# Patient Record
Sex: Female | Born: 1955 | Race: Asian | Hispanic: No | Marital: Married | Smoking: Never smoker
Health system: Southern US, Community
[De-identification: ages and names within clinical notes are randomized; demographics above are authoritative.]

## PROBLEM LIST (undated history)

## (undated) DIAGNOSIS — R140 Abdominal distension (gaseous): Secondary | ICD-10-CM

## (undated) DIAGNOSIS — R110 Nausea: Secondary | ICD-10-CM

## (undated) DIAGNOSIS — T7840XA Allergy, unspecified, initial encounter: Secondary | ICD-10-CM

## (undated) DIAGNOSIS — M1990 Unspecified osteoarthritis, unspecified site: Secondary | ICD-10-CM

## (undated) DIAGNOSIS — K219 Gastro-esophageal reflux disease without esophagitis: Secondary | ICD-10-CM

## (undated) DIAGNOSIS — M810 Age-related osteoporosis without current pathological fracture: Secondary | ICD-10-CM

## (undated) DIAGNOSIS — F419 Anxiety disorder, unspecified: Secondary | ICD-10-CM

## (undated) DIAGNOSIS — K5900 Constipation, unspecified: Secondary | ICD-10-CM

## (undated) HISTORY — DX: Allergy, unspecified, initial encounter: T78.40XA

## (undated) HISTORY — DX: Gastro-esophageal reflux disease without esophagitis: K21.9

## (undated) HISTORY — DX: Abdominal distension (gaseous): R14.0

## (undated) HISTORY — DX: Nausea: R11.0

## (undated) HISTORY — PX: COLONOSCOPY: SHX174

## (undated) HISTORY — DX: Anxiety disorder, unspecified: F41.9

## (undated) HISTORY — DX: Age-related osteoporosis without current pathological fracture: M81.0

## (undated) HISTORY — DX: Unspecified osteoarthritis, unspecified site: M19.90

## (undated) HISTORY — PX: UPPER GASTROINTESTINAL ENDOSCOPY: SHX188

## (undated) HISTORY — DX: Constipation, unspecified: K59.00

---

## 1999-01-30 ENCOUNTER — Ambulatory Visit (HOSPITAL_COMMUNITY): Admission: RE | Admit: 1999-01-30 | Discharge: 1999-01-30 | Payer: Self-pay | Admitting: Family Medicine

## 1999-01-30 ENCOUNTER — Encounter: Payer: Self-pay | Admitting: Family Medicine

## 2002-06-19 ENCOUNTER — Ambulatory Visit: Admission: RE | Admit: 2002-06-19 | Discharge: 2002-06-19 | Payer: Self-pay | Admitting: Family Medicine

## 2005-02-25 ENCOUNTER — Other Ambulatory Visit: Admission: RE | Admit: 2005-02-25 | Discharge: 2005-02-25 | Payer: Self-pay | Admitting: Family Medicine

## 2006-03-08 ENCOUNTER — Other Ambulatory Visit: Admission: RE | Admit: 2006-03-08 | Discharge: 2006-03-08 | Payer: Self-pay | Admitting: Family Medicine

## 2007-05-01 ENCOUNTER — Other Ambulatory Visit: Admission: RE | Admit: 2007-05-01 | Discharge: 2007-05-01 | Payer: Self-pay | Admitting: Family Medicine

## 2008-10-02 ENCOUNTER — Encounter: Payer: Self-pay | Admitting: Internal Medicine

## 2008-10-22 ENCOUNTER — Ambulatory Visit: Payer: Self-pay | Admitting: Internal Medicine

## 2008-10-22 DIAGNOSIS — Z8719 Personal history of other diseases of the digestive system: Secondary | ICD-10-CM | POA: Insufficient documentation

## 2008-10-22 DIAGNOSIS — R1012 Left upper quadrant pain: Secondary | ICD-10-CM | POA: Insufficient documentation

## 2008-10-22 DIAGNOSIS — K5900 Constipation, unspecified: Secondary | ICD-10-CM | POA: Insufficient documentation

## 2008-11-21 ENCOUNTER — Encounter: Payer: Self-pay | Admitting: Internal Medicine

## 2008-11-21 ENCOUNTER — Ambulatory Visit: Payer: Self-pay | Admitting: Internal Medicine

## 2008-11-23 ENCOUNTER — Encounter: Payer: Self-pay | Admitting: Internal Medicine

## 2009-01-27 ENCOUNTER — Ambulatory Visit: Payer: Self-pay | Admitting: Internal Medicine

## 2009-02-11 ENCOUNTER — Telehealth: Payer: Self-pay | Admitting: Internal Medicine

## 2009-03-11 ENCOUNTER — Telehealth: Payer: Self-pay | Admitting: Internal Medicine

## 2010-07-14 NOTE — Progress Notes (Signed)
Summary: wave cx fee  Phone Note Call from Patient Call back at Home Phone [phone]   Caller: Patient Call For: gessner Summary of Call: Patient states that she cx thru our phone tree and I verified that . We left her a message for her to cb and verify that she did want to cx but she never called back but she did hit the option to cx. Initial call taken by: Tawni Levy,  February 11, 2009 8:16 AM  Follow-up for Phone Call        ok wave the fee Follow-up by: Iva Boop MD, Clementeen Graham,  February 14, 2009 9:08 AM  Additional Follow-up for Phone Call Additional follow up Details #1::        02-18-09. Left message for patient regarding CX fee. We will waive this fee. I will send over to charge correction for credit.  02-19-09 No returned call from patient. Additional Follow-up by: Zackery Barefoot,  February 18, 2009 10:47 AM

## 2010-07-14 NOTE — Progress Notes (Signed)
Summary: NS fee  Phone Note Call from Patient Call back at Home Phone [phone]   Caller: Patient Call For: Dr. Leone Payor Reason for Call: Insurance Question Details for Reason: NS fee Summary of Call: 02-20-09 Called patient regarding NS fee. Agreed to waive fee due to patient cancelling on phone tree and was confirmed. I notified charge correction of this also and has been credited. Initial call taken by: Zackery Barefoot,  March 11, 2009 3:33 PM

## 2010-07-14 NOTE — Letter (Signed)
Summary: Patient Notice-Endo Biopsy Results  [facility]  [address] [place], [place] [address]   Phone: [phone]  Fax: [phone]        November 23, 2008 MRN: [id number]    [name] [address] [place], [place]  [address]    Dear [name],  I am pleased to inform you that the biopsies taken during your recent endoscopic examination did not show any evidence of cancer upon pathologic examination. They did show some inflammation of the stomach called gastritis. No infection was found.  Please take the medication that was prescibed and return to me in the office as directed.  Please call us if you are having persistent problems or have questions about your condition that have not been fully answered at this time.  Sincerely,  Iva Boop MD  This letter has been electronically signed by your physician.

## 2010-07-14 NOTE — Procedures (Signed)
Summary: EGD: erosive duodenitis, gastritis, H pylori negative   EGD  Procedure date:  11/21/2008  Findings:      Location: [facility]    EGD  Procedure date:  11/21/2008  Findings:      1) Erosion in the bulb of duodenum 2) Duodenitis in the bulb of duodenum 3) Moderate gastritis (biopsied) CHRONIC GASTRITIS NO H PYLORI 4) Otherwise normal examination  ENDOSCOPY PROCEDURE REPORT  PATIENTEurydice, [name]  MR#:  [id number] BIRTHDATE:   06-01-1956, 55 yrs. old   GENDER:   female  ENDOSCOPIST:   Iva Boop, MD, [facility] Referred by: Talbot Grumbling. Creta Levin, M.D.  PROCEDURE DATE:  11/21/2008 PROCEDURE:  EGD with biopsy ASA CLASS:   Class I INDICATIONS: abdominal pain, left upper quadr   MEDICATIONS:    Fentanyl 25 mcg IV, Versed 4 mg IV TOPICAL ANESTHETIC:   Exactacain Spray  DESCRIPTION OF PROCEDURE:   After the risks benefits and alternatives of the procedure were thoroughly explained, informed consent was obtained.  The [facility] GIF-H180 [id number] endoscope was introduced through the mouth and advanced to the second portion of the duodenum, without limitations.  The instrument was slowly withdrawn as the mucosa was fully examined. <<PROCEDUREIMAGES>>          <<OLD IMAGES>>  An erosion was found in the bulb of the duodenum.  Duodenitis was found in the bulb of the duodenum. Granular and erythematous mucosa with one erosion (1 mm).  Moderate gastritis was found. Patchy erythema, whie mucosal change, mottled mucosa.  Multiple biopsies were obtained and sent to pathology.  Otherwise the examination was normal.    Retroflexed views revealed no abnormalities.    The scope was then withdrawn from the patient and the procedure completed.  COMPLICATIONS:   None  ENDOSCOPIC IMPRESSION:  1) Erosion in the bulb of duodenum  2) Duodenitis in the bulb of duodenum  3) Moderate gastritis (biopsied)  4) Otherwise normal examination RECOMMENDATIONS:  Start pantoprazole 40 mg  each AM and stop Zantac (ranitidine).  Go to pharmacy to pick up.  REPEAT EXAM:   In for not necessary.    Iva Boop, MD, Clementeen Graham    CC: Talbot Grumbling. Creta Levin, MD The Patient     REPORT OF SURGICAL PATHOLOGY   Case #: [id number] Patient Name: [name] Office Chart Number:  [id number]   MRN: [id number] Pathologist: Beulah Gandy. Luisa Hart, MD DOB/Age  [birth year] (Age: 55)    Gender: F Date Taken:  11/21/2008 Date Received: 11/21/2008   FINAL DIAGNOSIS   ***MICROSCOPIC EXAMINATION AND DIAGNOSIS***   STOMACH:  CHRONIC GASTRITIS.  NO HELICOBACTER PYLORI, DYSPLASIA OR EVIDENCE OF MALIGNANCY IDENTIFIED.   COMMENT A Warthin-Starry stain is performed to determine the possibility of the presence of Helicobacter pylori. The Warthin-Starry stain is negative for organisms of Helicobacter pylori. The control(s) stained appropriately. (JP:jy) 11/22/08   jy Date Reported:  11/22/2008     Beulah Gandy. Luisa Hart, MD *** Electronically Signed Out By JDP ***     November 23, 2008 MRN: [id number]    [name] [address] [place], [place]  [address]   Dear [name],  I am pleased to inform you that the biopsies taken during your recent endoscopic examination did not show any evidence of cancer upon pathologic examination. They did show some inflammation of the stomach called gastritis. No infection was found.  Please take the medication that was prescibed and return to me in the office as directed.  Please call us if  you are having persistent problems or have questions about your condition that have not been fully answered at this time.  Sincerely,  Iva Boop MD  This letter has been electronically signed by your physician. This report was created from the original endoscopy report, which was reviewed and signed by the above listed endoscopist.

## 2010-07-14 NOTE — Progress Notes (Signed)
Summary: Med List/[facility]  Med List/[facility]   Imported By: Sherian Rein 11/06/2008 13:22:37  _____________________________________________________________________  External Attachment:    Type:   Image     Comment:   External Document

## 2010-07-14 NOTE — Procedures (Signed)
Summary: Colonoscopy: internal hemorrhoids, Amitiza started   Colonoscopy  Procedure date:  11/21/2008  Findings:      Location:  [facility].    Procedures Next Due Date:    Colonoscopy: 11/2018  COLONOSCOPY PROCEDURE REPORT  PATIENT:  Nalaysia, Manganiello  MR#:  [id number] BIRTHDATE:   17-Oct-1955, 52 yrs. old   GENDER:   female  ENDOSCOPIST:   Iva Boop, MD, [facility] Referred by: Talbot Grumbling. Creta Levin, M.D.  PROCEDURE DATE:  11/21/2008 PROCEDURE:  Colonoscopy 25366 ASA CLASS:   Class I INDICATIONS: Routine Risk Screening   MEDICATIONS:    Fentanyl 25 mcg, There was residual sedation effect present from prior procedure.  DESCRIPTION OF PROCEDURE:   After the risks benefits and alternatives of the procedure were thoroughly explained, informed consent was obtained.  Digital rectal exam was performed and revealed no abnormalities.   The [facility] [id number]-H180AL [id number] endoscope was introduced through the anus and advanced to the cecum in 2:15 minutes, which was identified by both the appendix and ileocecal valve, without limitations.  The quality of the prep was excellent, using MoviPrep.  The instrument was then slowly withdrawn as the colon was fully examined in 7:07 minutes. <<PROCEDUREIMAGES>>    <<OLD IMAGES>>  FINDINGS:  A normal appearing cecum, ileocecal valve, and appendiceal orifice were identified. The ascending, hepatic flexure, transverse, splenic flexure, descending, sigmoid colon, and rectum appeared unremarkable.   Retroflexed views in the rectum revealed internal hemorrhoids.    The scope was then withdrawn from the patient and the procedure completed.  COMPLICATIONS:   None  ENDOSCOPIC IMPRESSION:  1) Normal colon, excellent prep  2) Internal hemorrhoids in rectum RECOMMENDATIONS:  Start Amitiza 8 mcg 2 times a day for chronic constipation. Go to pharmacyto pick up.    Call Dr. Marvell Fuller office to see him again in 6- 8 weeks (July-Aug appointment)  REPEAT  EXAM:   In 10 year(s) for routine screening colonocsopy.    Iva Boop, MD, Clementeen Graham  CC: Halina Maidens, MD The Patient    Appended Document: Colonoscopy Left message on patient's voicemail that we have scheduled her a follow up appointment to see Dr Leone Payor on January 08, 2009 @ 1:30 pm. Patient is to call our office if this time and date is not convienient for her.

## 2010-07-14 NOTE — Miscellaneous (Signed)
Summary: Pantoprazole/Amitiza Rx

## 2011-08-31 ENCOUNTER — Ambulatory Visit (INDEPENDENT_AMBULATORY_CARE_PROVIDER_SITE_OTHER): Payer: PRIVATE HEALTH INSURANCE | Admitting: Family Medicine

## 2011-08-31 VITALS — BP 129/88 | HR 78 | Temp 97.4°F | Resp 16 | Ht 60.5 in | Wt 139.2 lb

## 2011-08-31 DIAGNOSIS — J309 Allergic rhinitis, unspecified: Secondary | ICD-10-CM

## 2011-08-31 DIAGNOSIS — J329 Chronic sinusitis, unspecified: Secondary | ICD-10-CM

## 2011-08-31 DIAGNOSIS — J019 Acute sinusitis, unspecified: Secondary | ICD-10-CM

## 2011-08-31 DIAGNOSIS — R059 Cough, unspecified: Secondary | ICD-10-CM

## 2011-08-31 DIAGNOSIS — J301 Allergic rhinitis due to pollen: Secondary | ICD-10-CM

## 2011-08-31 DIAGNOSIS — R05 Cough: Secondary | ICD-10-CM

## 2011-08-31 MED ORDER — HYDROCODONE-HOMATROPINE 5-1.5 MG/5ML PO SYRP: 5.0000 mL | ORAL_SOLUTION | Freq: Three times a day (TID) | ORAL | Status: DC | PRN

## 2011-08-31 MED ORDER — BENZONATATE 100 MG PO CAPS: 100.0000 mg | ORAL_CAPSULE | Freq: Two times a day (BID) | ORAL | Status: AC | PRN

## 2011-08-31 MED ORDER — AZITHROMYCIN 250 MG PO TABS: ORAL_TABLET | ORAL | Status: AC

## 2011-08-31 MED ORDER — FLUTICASONE PROPIONATE 50 MCG/ACT NA SUSP: 2.0000 | Freq: Every day | NASAL | Status: AC

## 2011-08-31 NOTE — Progress Notes (Signed)
  [facility]:  Office Visit  Chief Complaint:  Chief Complaint  Patient presents with  . Cough    1 week  . Sinusitis    HPI: [name] is a 56 y.o. female who complains of  1 week h/o sinus pressure, frontal HA, and prodcutive clear cough with PND.  Denies Fevers, chills, msk aches, CP, SOB. + Allergies, on Zyrtec ( off and on). Tried OTC cough meds and NSAIDs without relief.  Past Medical History  Diagnosis Date  . Allergy    History reviewed. No pertinent past surgical history. History   Social History  . Marital Status: Married    Spouse Name: N/A    Number of Children: N/A  . Years of Education: N/A   Social History Main Topics  . Smoking status: Never Smoker   . Smokeless tobacco: None  . Alcohol Use: No  . Drug Use: No  . Sexually Active: None   Other Topics Concern  . None   Social History Narrative  . None   Family History  Problem Relation Age of Onset  . Arthritis Mother   . Hypertension Mother    No Known Allergies Prior to Admission medications   Medication Sig Start Date End Date Taking? Authorizing Provider  calcium carbonate 200 MG capsule Take 250 mg by mouth 2 (two) times daily with a meal.   Yes Historical Provider, MD  fish oil-omega-3 fatty acids 1000 MG capsule Take 2 g by mouth daily.   Yes Historical Provider, MD     ROS: The patient denies fevers, chills, night sweats, unintentional weight loss, chest pain, palpitations, wheezing, dyspnea on exertion, nausea, vomiting, abdominal pain, dysuria, hematuria, melena, numbness, weakness, or tingling. + cough  All other systems have been reviewed and were otherwise negative with the exception of those mentioned in the HPI and as above.    PHYSICAL EXAM: Filed Vitals:   08/31/11 1051  BP: 129/88  Pulse: 78  Temp: 97.4 F (36.3 C)  Resp: 16   Filed Vitals:   08/31/11 1051  Height: 5' 0.5" (1.537 m)  Weight: 139 lb 3.2 oz (63.141 kg)   Body mass index is 26.74  kg/(m^2).  General: Alert, no acute distress HEENT:  Normocephalic, atraumatic, oropharynx patent. Tm nl, + sinus pressure, erythematous boggy nares, OP erythematous, nl tonsils Cardiovascular:  Regular rate and rhythm, no rubs murmurs or gallops.  No Carotid bruits, radial pulse intact. No pedal edema.  Respiratory: Clear to auscultation bilaterally.  No wheezes, rales, or rhonchi.  No cyanosis, no use of accessory musculature GI: No organomegaly, abdomen is soft and non-tender, positive bowel sounds.  No masses. Skin: No rashes. Neurologic: Facial musculature symmetric. Psychiatric: Patient is appropriate throughout our interaction. Lymphatic: No cervical lymphadenopathy Musculoskeletal: Gait intact.   LABS: No results found for this or any previous visit.   EKG/XRAY:   Primary read interpreted by Dr. Conley Rolls at [facility].   ASSESSMENT/PLAN: Encounter Diagnoses  Name Primary?  . Sinusitis Yes  . Rhinitis, allergic     Sxs treatment first then abx if no improvement in 4-5 days.  Flonase, tessalon perles, hydromet. Again emphasize that if no improvement then take Z-pack.    Reynard Christoffersen PHUONG, DO 08/31/2011 11:43 AM

## 2012-06-10 ENCOUNTER — Ambulatory Visit: Payer: PRIVATE HEALTH INSURANCE

## 2012-06-10 ENCOUNTER — Ambulatory Visit (INDEPENDENT_AMBULATORY_CARE_PROVIDER_SITE_OTHER): Payer: PRIVATE HEALTH INSURANCE | Admitting: Family Medicine

## 2012-06-10 VITALS — BP 159/90 | HR 93 | Temp 100.8°F | Resp 16 | Ht 61.0 in | Wt 133.6 lb

## 2012-06-10 DIAGNOSIS — R509 Fever, unspecified: Secondary | ICD-10-CM

## 2012-06-10 DIAGNOSIS — R0781 Pleurodynia: Secondary | ICD-10-CM

## 2012-06-10 DIAGNOSIS — J040 Acute laryngitis: Secondary | ICD-10-CM

## 2012-06-10 DIAGNOSIS — R079 Chest pain, unspecified: Secondary | ICD-10-CM

## 2012-06-10 DIAGNOSIS — R059 Cough, unspecified: Secondary | ICD-10-CM

## 2012-06-10 DIAGNOSIS — R05 Cough: Secondary | ICD-10-CM

## 2012-06-10 LAB — POCT CBC
Granulocyte percent: 83.7 %G — AB (ref 37–80)
HCT, POC: 42.6 % (ref 37.7–47.9)
Hemoglobin: 13.2 g/dL (ref 12.2–16.2)
Lymph, poc: 1.5 (ref 0.6–3.4)
MCH, POC: 29.5 pg (ref 27–31.2)
MCHC: 31 g/dL — AB (ref 31.8–35.4)
MCV: 95.2 fL (ref 80–97)
MID (cbc): 0.5 (ref 0–0.9)
MPV: 8.9 fL (ref 0–99.8)
POC Granulocyte: 10.2 — AB (ref 2–6.9)
POC LYMPH PERCENT: 12 %L (ref 10–50)
POC MID %: 4.3 %M (ref 0–12)
Platelet Count, POC: 343 10*3/uL (ref 142–424)
RBC: 4.47 M/uL (ref 4.04–5.48)
RDW, POC: 13 %
WBC: 12.2 10*3/uL — AB (ref 4.6–10.2)

## 2012-06-10 LAB — POCT INFLUENZA A/B
Influenza A, POC: NEGATIVE
Influenza B, POC: NEGATIVE

## 2012-06-10 MED ORDER — AMOXICILLIN 875 MG PO TABS: 875.0000 mg | ORAL_TABLET | Freq: Two times a day (BID) | ORAL | Status: DC

## 2012-06-10 NOTE — Progress Notes (Signed)
Patient ID: [name] MRN: [id number], DOB: 1955/12/21, 56 y.o. Date of Encounter: 06/10/2012, 4:59 PM  Primary Physician: No primary provider on file.  Chief Complaint: fever, chills, nasal congestion  HPI: 56 y.o. year old female with presents with 1 day history of nasal congestion, fever, chills, sore throat, and slight dry cough. Denies chest pain, SOB, nausea, vomiting, or dizziness. Has not taken any medications for this. Also complains of painful lump on her back x 1 year. States it intermittently is painful when she lays down. No drainage, erythema, warmth, or tenderness.    Patient is otherwise doing well without issues or complaints.  Past Medical History  Diagnosis Date  . Allergy      Home Meds: Prior to Admission medications   Medication Sig Start Date End Date Taking? Authorizing Provider  calcium carbonate 200 MG capsule Take 250 mg by mouth 2 (two) times daily with a meal.   Yes Historical Provider, MD  fish oil-omega-3 fatty acids 1000 MG capsule Take 2 g by mouth daily.   Yes Historical Provider, MD  fluticasone (FLONASE) 50 MCG/ACT nasal spray Place 2 sprays into the nose daily. 08/31/11 08/30/12  Thao P Le, DO  HYDROcodone-homatropine (HYCODAN) 5-1.5 MG/5ML syrup Take 5 mLs by mouth every 8 (eight) hours as needed for cough. 08/31/11   Thao P Le, DO    Allergies: No Known Allergies  History   Social History  . Marital Status: Married    Spouse Name: N/A    Number of Children: N/A  . Years of Education: N/A   Occupational History  . Not on file.   Social History Main Topics  . Smoking status: Never Smoker   . Smokeless tobacco: Not on file  . Alcohol Use: No  . Drug Use: No  . Sexually Active: Not on file   Other Topics Concern  . Not on file   Social History Narrative  . No narrative on file     Review of Systems: Constitutional: positive for chills and fever. Negative for night sweats, weight changes, or fatigue  HEENT: negative for vision  changes, hearing loss. Positive for congestion, rhinorrhea, ST. No epistaxis, or sinus pressure Cardiovascular: negative for chest pain or palpitations Respiratory: positive for cough. negative for hemoptysis, wheezing, shortness of breath. Abdominal: negative for abdominal pain, nausea, vomiting, diarrhea, or constipation Dermatological: negative for rashes. Positive for pain of left posterior ribs Neurologic: negative for headache, dizziness, or syncope   Physical Exam: Blood pressure 159/90, pulse 93, temperature 100.8 F (38.2 C), temperature source Oral, resp. rate 16, height 5\' 1"  (1.549 m), weight 133 lb 9.6 oz (60.601 kg), SpO2 100.00%., Body mass index is 25.24 kg/(m^2). General: Well developed, well nourished, in no acute distress. Head: Normocephalic, atraumatic, eyes without discharge, sclera non-icteric, nares are without discharge. Bilateral auditory canals clear, TM's are without perforation, pearly grey and translucent with reflective cone of light bilaterally. Oral cavity moist, posterior pharynx without exudate, erythema, peritonsillar abscess, or post nasal drip.  Neck: Supple. No thyromegaly. Full ROM. No lymphadenopathy. Lungs: Clear bilaterally to auscultation without wheezes, rales, or rhonchi. Breathing is unlabored. Heart: RRR with S1 S2. No murmurs, rubs, or gallops appreciated. Msk:  Strength and tone normal for age. Extremities/Skin: Warm and dry. No clubbing or cyanosis. No edema. Pain to palpation of left mid-thoracic rib just lateral to midline.  No erythema, warmth, drainage, or fluctuance. No palpable lump or nodule.  Neuro: Alert and oriented X 3. Moves all extremities  spontaneously. Gait is normal. CNII-XII grossly in tact. Psych:  Responds to questions appropriately with a normal affect.   Labs: Results for orders placed in visit on 06/10/12  POCT INFLUENZA A/B      Component Value Range   Influenza A, POC Negative     Influenza B, POC Negative    POCT  CBC      Component Value Range   WBC 12.2 (*) 4.6 - 10.2 K/uL   Lymph, poc 1.5  0.6 - 3.4   POC LYMPH PERCENT 12.0  10 - 50 %L   MID (cbc) 0.5  0 - 0.9   POC MID % 4.3  0 - 12 %M   POC Granulocyte 10.2 (*) 2 - 6.9   Granulocyte percent 83.7 (*) 37 - 80 %G   RBC 4.47  4.04 - 5.48 M/uL   Hemoglobin 13.2  12.2 - 16.2 g/dL   HCT, POC 40.9  81.1 - 47.9 %   MCV 95.2  80 - 97 fL   MCH, POC 29.5  27 - 31.2 pg   MCHC 31.0 (*) 31.8 - 35.4 g/dL   RDW, POC 91.4     Platelet Count, POC 343  142 - 424 K/uL   MPV 8.9  0 - 99.8 fL   [facility] reading (PRIMARY) by  Dr. Neva Seat as scoliosis. No acute bony abnormality or infiltrate.    ASSESSMENT AND PLAN:  56 y.o. year old female with tonsillitis.  Increase fluids and rest Ibuprofen and tylenol in alternating doses q3hours Start amoxicillin 875 mg bid Mucinex-DM Zyrtec daily to help with postnasal drip and laryngitis. Follow up if symptoms worsen or fail to improve.  Grier Mitts, PA-C 06/10/2012 4:59 PM

## 2012-06-10 NOTE — Patient Instructions (Addendum)
Take Mucinex-DM as directed  Increase fluids and rest Zyrtec daily to help with postnasal drainage and hoareness.

## 2012-06-11 NOTE — Progress Notes (Signed)
Xray read and patient discussed with Ms. Marte. Agree with assessment and plan of care per her note.   

## 2014-02-14 ENCOUNTER — Encounter: Payer: Self-pay | Admitting: Internal Medicine

## 2014-07-25 ENCOUNTER — Other Ambulatory Visit: Payer: Self-pay | Admitting: Family Medicine

## 2014-07-25 DIAGNOSIS — E049 Nontoxic goiter, unspecified: Secondary | ICD-10-CM

## 2014-08-06 ENCOUNTER — Ambulatory Visit
Admission: RE | Admit: 2014-08-06 | Discharge: 2014-08-06 | Disposition: A | Discharge status: Home / Self Care | Payer: 59 | Source: Ambulatory Visit | Attending: Family Medicine | Admitting: Family Medicine

## 2014-08-06 DIAGNOSIS — E049 Nontoxic goiter, unspecified: Secondary | ICD-10-CM

## 2015-06-25 ENCOUNTER — Other Ambulatory Visit (HOSPITAL_COMMUNITY)
Admission: RE | Admit: 2015-06-25 | Discharge: 2015-06-25 | Disposition: A | Discharge status: Home / Self Care | Payer: Commercial Managed Care - PPO | Source: Ambulatory Visit | Attending: Family Medicine | Admitting: Family Medicine

## 2015-06-25 ENCOUNTER — Other Ambulatory Visit: Payer: Self-pay | Admitting: Family Medicine

## 2015-06-25 DIAGNOSIS — Z124 Encounter for screening for malignant neoplasm of cervix: Secondary | ICD-10-CM | POA: Diagnosis present

## 2015-06-25 DIAGNOSIS — Z1151 Encounter for screening for human papillomavirus (HPV): Secondary | ICD-10-CM | POA: Insufficient documentation

## 2015-06-26 LAB — CYTOLOGY - PAP

## 2016-12-01 DIAGNOSIS — Z79899 Other long term (current) drug therapy: Secondary | ICD-10-CM | POA: Diagnosis not present

## 2016-12-01 DIAGNOSIS — Z1159 Encounter for screening for other viral diseases: Secondary | ICD-10-CM | POA: Diagnosis not present

## 2016-12-01 DIAGNOSIS — Z0000 Encounter for general adult medical examination without abnormal findings: Secondary | ICD-10-CM | POA: Diagnosis not present

## 2016-12-01 DIAGNOSIS — E049 Nontoxic goiter, unspecified: Secondary | ICD-10-CM | POA: Diagnosis not present

## 2016-12-01 DIAGNOSIS — M810 Age-related osteoporosis without current pathological fracture: Secondary | ICD-10-CM | POA: Diagnosis not present

## 2016-12-01 DIAGNOSIS — Z23 Encounter for immunization: Secondary | ICD-10-CM | POA: Diagnosis not present

## 2016-12-06 DIAGNOSIS — Z1231 Encounter for screening mammogram for malignant neoplasm of breast: Secondary | ICD-10-CM | POA: Diagnosis not present

## 2016-12-07 DIAGNOSIS — Z0000 Encounter for general adult medical examination without abnormal findings: Secondary | ICD-10-CM | POA: Diagnosis not present

## 2016-12-28 ENCOUNTER — Encounter (HOSPITAL_COMMUNITY): Payer: Self-pay

## 2016-12-28 ENCOUNTER — Ambulatory Visit (HOSPITAL_COMMUNITY)
Admission: RE | Admit: 2016-12-28 | Discharge: 2016-12-28 | Disposition: A | Discharge status: Home / Self Care | Payer: Commercial Managed Care - PPO | Source: Ambulatory Visit | Attending: Family Medicine | Admitting: Family Medicine

## 2016-12-28 DIAGNOSIS — M810 Age-related osteoporosis without current pathological fracture: Secondary | ICD-10-CM | POA: Diagnosis not present

## 2016-12-28 MED ORDER — ZOLEDRONIC ACID 5 MG/100ML IV SOLN
5.0000 mg | Freq: Once | INTRAVENOUS | Status: AC
  Administered 2016-12-28: 5 mg via INTRAVENOUS
  Filled 2016-12-28: qty 100

## 2016-12-28 MED ORDER — SODIUM CHLORIDE 0.9 % IV SOLN
INTRAVENOUS | Status: DC
  Administered 2016-12-28: 08:00:00 via INTRAVENOUS

## 2016-12-28 NOTE — Progress Notes (Signed)
First Reclast infusion, uneventful.  D/c instructions given to pt about Reclast.  Answered pt's questions.  Pt observed for 25 minutes post infusion, since it was the first time receiving Reclast.  Pt was d/c ambulatory to lobby.

## 2016-12-28 NOTE — Discharge Instructions (Signed)
Reclast is a yearly medicine.  You received Reclast today, December 28, 2016.  The earliest you can receive Reclast again is December 29, 2017.  Your doctor will set up this appointment because your Calcium and Creatinine level needs to be checked, these labs have to be drawn within 30 days of receiving the Reclast.  Your doctor will draw these and then set up your next [facility] appointment.     Zoledronic Acid injection (Paget's Disease, Osteoporosis) What is this medicine? ZOLEDRONIC ACID (ZOE le dron ik AS id) lowers the amount of calcium loss from bone. It is used to treat Paget's disease and osteoporosis in women. This medicine may be used for other purposes; ask your health care provider or pharmacist if you have questions. COMMON BRAND NAME(S): Reclast, Zometa What should I tell my health care provider before I take this medicine? They need to know if you have any of these conditions: -aspirin-sensitive asthma -cancer, especially if you are receiving medicines used to treat cancer -dental disease or wear dentures -infection -kidney disease -low levels of calcium in the blood -past surgery on the parathyroid gland or intestines -receiving corticosteroids like dexamethasone or prednisone -an unusual or allergic reaction to zoledronic acid, other medicines, foods, dyes, or preservatives -pregnant or trying to get pregnant -breast-feeding How should I use this medicine? This medicine is for infusion into a vein. It is given by a health care professional in a hospital or clinic setting. Talk to your pediatrician regarding the use of this medicine in children. This medicine is not approved for use in children. Overdosage: If you think you have taken too much of this medicine contact a poison control center or emergency room at once. NOTE: This medicine is only for you. Do not share this medicine with others. What if I miss a dose? It is important not to miss your dose. Call your doctor or health  care professional if you are unable to keep an appointment. What may interact with this medicine? -certain antibiotics given by injection -NSAIDs, medicines for pain and inflammation, like ibuprofen or naproxen -some diuretics like bumetanide, furosemide -teriparatide This list may not describe all possible interactions. Give your health care provider a list of all the medicines, herbs, non-prescription drugs, or dietary supplements you use. Also tell them if you smoke, drink alcohol, or use illegal drugs. Some items may interact with your medicine. What should I watch for while using this medicine? Visit your doctor or health care professional for regular checkups. It may be some time before you see the benefit from this medicine. Do not stop taking your medicine unless your doctor tells you to. Your doctor may order blood tests or other tests to see how you are doing. Women should inform their doctor if they wish to become pregnant or think they might be pregnant. There is a potential for serious side effects to an unborn child. Talk to your health care professional or pharmacist for more information. You should make sure that you get enough calcium and vitamin D while you are taking this medicine. Discuss the foods you eat and the vitamins you take with your health care professional. Some people who take this medicine have severe bone, joint, and/or muscle pain. This medicine may also increase your risk for jaw problems or a broken thigh bone. Tell your doctor right away if you have severe pain in your jaw, bones, joints, or muscles. Tell your doctor if you have any pain that does not go away  or that gets worse. Tell your dentist and dental surgeon that you are taking this medicine. You should not have major dental surgery while on this medicine. See your dentist to have a dental exam and fix any dental problems before starting this medicine. Take good care of your teeth while on this medicine. Make  sure you see your dentist for regular follow-up appointments. What side effects may I notice from receiving this medicine? Side effects that you should report to your doctor or health care professional as soon as possible: -allergic reactions like skin rash, itching or hives, swelling of the face, lips, or tongue -anxiety, confusion, or depression -breathing problems -changes in vision -eye pain -feeling faint or lightheaded, falls -jaw pain, especially after dental work -mouth sores -muscle cramps, stiffness, or weakness -redness, blistering, peeling or loosening of the skin, including inside the mouth -trouble passing urine or change in the amount of urine Side effects that usually do not require medical attention (report to your doctor or health care professional if they continue or are bothersome): -bone, joint, or muscle pain -constipation -diarrhea -fever -hair loss -irritation at site where injected -loss of appetite -nausea, vomiting -stomach upset -trouble sleeping -trouble swallowing -weak or tired This list may not describe all possible side effects. Call your doctor for medical advice about side effects. You may report side effects to FDA at 1-800-FDA-1088. Where should I keep my medicine? This drug is given in a hospital or clinic and will not be stored at home. NOTE: This sheet is a summary. It may not cover all possible information. If you have questions about this medicine, talk to your doctor, pharmacist, or health care provider.  2018 Elsevier/Gold Standard (2013-10-27 14:19:57)

## 2017-07-04 DIAGNOSIS — R0602 Shortness of breath: Secondary | ICD-10-CM | POA: Diagnosis not present

## 2017-07-04 DIAGNOSIS — Z13220 Encounter for screening for lipoid disorders: Secondary | ICD-10-CM | POA: Diagnosis not present

## 2017-07-04 DIAGNOSIS — E049 Nontoxic goiter, unspecified: Secondary | ICD-10-CM | POA: Diagnosis not present

## 2017-07-04 DIAGNOSIS — Z0189 Encounter for other specified special examinations: Secondary | ICD-10-CM | POA: Diagnosis not present

## 2017-07-04 DIAGNOSIS — R002 Palpitations: Secondary | ICD-10-CM | POA: Diagnosis not present

## 2017-08-01 DIAGNOSIS — R0602 Shortness of breath: Secondary | ICD-10-CM | POA: Diagnosis not present

## 2017-08-02 DIAGNOSIS — R0602 Shortness of breath: Secondary | ICD-10-CM | POA: Diagnosis not present

## 2017-08-15 DIAGNOSIS — R0602 Shortness of breath: Secondary | ICD-10-CM | POA: Diagnosis not present

## 2017-08-15 DIAGNOSIS — Z0189 Encounter for other specified special examinations: Secondary | ICD-10-CM | POA: Diagnosis not present

## 2017-08-15 DIAGNOSIS — R002 Palpitations: Secondary | ICD-10-CM | POA: Diagnosis not present

## 2017-12-12 DIAGNOSIS — Z1231 Encounter for screening mammogram for malignant neoplasm of breast: Secondary | ICD-10-CM | POA: Diagnosis not present

## 2017-12-23 DIAGNOSIS — Z23 Encounter for immunization: Secondary | ICD-10-CM | POA: Diagnosis not present

## 2017-12-23 DIAGNOSIS — Z0000 Encounter for general adult medical examination without abnormal findings: Secondary | ICD-10-CM | POA: Diagnosis not present

## 2017-12-23 DIAGNOSIS — Z6826 Body mass index (BMI) 26.0-26.9, adult: Secondary | ICD-10-CM | POA: Diagnosis not present

## 2018-02-08 DIAGNOSIS — M810 Age-related osteoporosis without current pathological fracture: Secondary | ICD-10-CM | POA: Diagnosis not present

## 2018-02-08 DIAGNOSIS — M8588 Other specified disorders of bone density and structure, other site: Secondary | ICD-10-CM | POA: Diagnosis not present

## 2019-03-30 ENCOUNTER — Encounter: Payer: Self-pay | Admitting: Internal Medicine

## 2019-04-10 ENCOUNTER — Other Ambulatory Visit: Payer: Self-pay | Admitting: Family Medicine

## 2019-04-10 DIAGNOSIS — R102 Pelvic and perineal pain: Secondary | ICD-10-CM

## 2019-04-20 ENCOUNTER — Ambulatory Visit (AMBULATORY_SURGERY_CENTER): Payer: Commercial Managed Care - PPO | Admitting: *Deleted

## 2019-04-20 ENCOUNTER — Telehealth: Payer: Self-pay | Admitting: *Deleted

## 2019-04-20 ENCOUNTER — Other Ambulatory Visit: Payer: Self-pay

## 2019-04-20 VITALS — Temp 97.3°F | Ht 60.0 in | Wt 137.0 lb

## 2019-04-20 DIAGNOSIS — Z1211 Encounter for screening for malignant neoplasm of colon: Secondary | ICD-10-CM

## 2019-04-20 DIAGNOSIS — R110 Nausea: Secondary | ICD-10-CM

## 2019-04-20 DIAGNOSIS — R140 Abdominal distension (gaseous): Secondary | ICD-10-CM

## 2019-04-20 DIAGNOSIS — Z1159 Encounter for screening for other viral diseases: Secondary | ICD-10-CM

## 2019-04-20 NOTE — Progress Notes (Signed)
No egg or soy allergy known to patient  No issues with past sedation with any surgeries  or procedures, no intubation problems  No diet pills per patient No home 02 use per patient  No blood thinners per patient  Pt denies issues with constipation - she states not having issues now but has occ loose stools  No A fib or A flutter  EMMI video sent to pt's e mail   covid test 11-11 at 1255 pm   Due to the COVID-19 pandemic we are asking patients to follow these guidelines. Please only bring one care partner. Please be aware that your care partner may wait in the car in the parking lot or if they feel like they will be too hot to wait in the car, they may wait in the lobby on the 4th floor. All care partners are required to wear a mask the entire time (we do not have any that we can provide them), they need to practice social distancing, and we will do a Covid check for all patient's and care partners when you arrive. Also we will check their temperature and your temperature. If the care partner waits in their car they need to stay in the parking lot the entire time and we will call them on their cell phone when the patient is ready for discharge so they can bring the car to the front of the building. Also all patient's will need to wear a mask into building.  Pt wants to add EGD - SEE TE to Dr Carlean Purl from today's [place]

## 2019-04-20 NOTE — Telephone Encounter (Signed)
Blocked 4 pm as per Dr Carlean Purl- to add egd to colon 11-16 at 2 pm --  Pt aware of double procedure 11-16    New amb ref sent to billing due to adding egd

## 2019-04-20 NOTE — Telephone Encounter (Signed)
Block the 430 and make her a double for 11/16

## 2019-04-20 NOTE — Telephone Encounter (Signed)
Pt in [place] today-    She is stating she has bloating, nausea in the mornings, reflux with sour stomach- she is asking for an EGD to be added to her colon- she states these issues have been on going since you last saw her in 2010- she had an EGD 11-21-2008 with you and she had gastritis, duodenitis and an erosion of the duodenum.     I tried to schedule her an OV but she cannot come in 11-10 when you have an opening and she didn't want to wait until 12-24-   She said she's off the 16,17,18 of November but you had no openings She said she will proceed with the colon 11-16 and I wanted to see if you wanted to do anything different ?  Please advise, Thanks Lelan Pons

## 2019-04-23 ENCOUNTER — Encounter: Payer: Self-pay | Admitting: Internal Medicine

## 2019-04-25 ENCOUNTER — Ambulatory Visit (INDEPENDENT_AMBULATORY_CARE_PROVIDER_SITE_OTHER): Payer: Commercial Managed Care - PPO

## 2019-04-25 ENCOUNTER — Other Ambulatory Visit: Payer: 59

## 2019-04-25 DIAGNOSIS — Z1159 Encounter for screening for other viral diseases: Secondary | ICD-10-CM

## 2019-04-26 LAB — SARS CORONAVIRUS 2 (TAT 6-24 HRS): SARS Coronavirus 2: NEGATIVE

## 2019-04-30 ENCOUNTER — Ambulatory Visit (AMBULATORY_SURGERY_CENTER): Payer: Commercial Managed Care - PPO | Admitting: Internal Medicine

## 2019-04-30 ENCOUNTER — Other Ambulatory Visit: Payer: Self-pay

## 2019-04-30 ENCOUNTER — Encounter: Payer: Self-pay | Admitting: Internal Medicine

## 2019-04-30 VITALS — BP 131/85 | HR 75 | Temp 98.3°F | Resp 15 | Ht 60.0 in | Wt 137.0 lb

## 2019-04-30 DIAGNOSIS — D122 Benign neoplasm of ascending colon: Secondary | ICD-10-CM

## 2019-04-30 DIAGNOSIS — K30 Functional dyspepsia: Secondary | ICD-10-CM | POA: Diagnosis not present

## 2019-04-30 DIAGNOSIS — R110 Nausea: Secondary | ICD-10-CM

## 2019-04-30 DIAGNOSIS — Z1211 Encounter for screening for malignant neoplasm of colon: Secondary | ICD-10-CM | POA: Diagnosis not present

## 2019-04-30 DIAGNOSIS — R1013 Epigastric pain: Secondary | ICD-10-CM

## 2019-04-30 MED ORDER — SODIUM CHLORIDE 0.9 % IV SOLN: 500.0000 mL | Freq: Once | INTRAVENOUS | Status: DC

## 2019-04-30 NOTE — Progress Notes (Signed)
Called to room to assist during endoscopic procedure.  Patient ID and intended procedure confirmed with present staff. Received instructions for my participation in the procedure from the performing physician.  

## 2019-04-30 NOTE — Progress Notes (Signed)
To PACU, VSS. Report to [email or web address].tb 

## 2019-04-30 NOTE — Progress Notes (Signed)
Vitals-CW Temp-JB  Pt's states no medical or surgical changes since previsit or office visit. 

## 2019-04-30 NOTE — Op Note (Signed)
[facility] Patient Name: [name] Procedure Date: 04/30/2019 2:07 PM MRN: [id number] Endoscopist: Gatha Mayer , MD Age: 63 Referring MD:  Date of Birth: 26-Oct-1955 Gender: Female Account #: [id number] Procedure:                Colonoscopy Indications:              Screening for colorectal malignant neoplasm, Last                            colonoscopy: 2010 Medicines:                Propofol per Anesthesia, Monitored Anesthesia Care Procedure:                Pre-Anesthesia Assessment:                           - Prior to the procedure, a History and Physical                            was performed, and patient medications and                            allergies were reviewed. The patient's tolerance of                            previous anesthesia was also reviewed. The risks                            and benefits of the procedure and the sedation                            options and risks were discussed with the patient.                            All questions were answered, and informed consent                            was obtained. Prior Anticoagulants: The patient has                            taken no previous anticoagulant or antiplatelet                            agents. ASA Grade Assessment: II - A patient with                            mild systemic disease. After reviewing the risks                            and benefits, the patient was deemed in                            satisfactory condition to undergo the procedure.  After obtaining informed consent, the colonoscope                            was passed under direct vision. Throughout the                            procedure, the patient's blood pressure, pulse, and                            oxygen saturations were monitored continuously. The                            Colonoscope was introduced through the anus and                            advanced to the the  terminal ileum, with                            identification of the appendiceal orifice and IC                            valve. The colonoscopy was performed without                            difficulty. The patient tolerated the procedure                            well. The quality of the bowel preparation was                            excellent. The bowel preparation used was Miralax                            via split dose instruction. The ileocecal valve,                            appendiceal orifice, and rectum were photographed. Scope In: 2:23:42 PM Scope Out: 2:35:30 PM Scope Withdrawal Time: 0 hours 9 minutes 1 second  Total Procedure Duration: 0 hours 11 minutes 48 seconds  Findings:                 The perianal and digital rectal examinations were                            normal.                           A diminutive polyp was found in the ascending                            colon. The polyp was sessile. The polyp was removed                            with a cold snare. Resection and retrieval were  complete. Verification of patient identification                            for the specimen was done. Estimated blood loss was                            minimal.                           Scattered diverticula were found in the entire                            colon.                           The exam was otherwise without abnormality on                            direct and retroflexion views. Complications:            No immediate complications. Estimated Blood Loss:     Estimated blood loss was minimal. Impression:               - One diminutive polyp in the ascending colon,                            removed with a cold snare. Resected and retrieved.                           - Diverticulosis in the entire examined colon.                           - The examination was otherwise normal on direct                            and  retroflexion views. Recommendation:           - Patient has a contact number available for                            emergencies. The signs and symptoms of potential                            delayed complications were discussed with the                            patient. Return to normal activities tomorrow.                            Written discharge instructions were provided to the                            patient.                           - GERD diet.                           -  Repeat colonoscopy is recommended. The                            colonoscopy date will be determined after pathology                            results from today's exam become available for                            review. Gatha Mayer, MD 04/30/2019 2:51:17 PM This report has been signed electronically.

## 2019-04-30 NOTE — Op Note (Signed)
[facility] Patient Name: [name] Procedure Date: 04/30/2019 2:07 PM MRN: [id number] Endoscopist: Gatha Mayer , MD Age: 63 Referring MD:  Date of Birth: 11-Apr-1956 Gender: Female Account #: [id number] Procedure:                Upper GI endoscopy Indications:              Dyspepsia Medicines:                Propofol per Anesthesia, Monitored Anesthesia Care Procedure:                Pre-Anesthesia Assessment:                           - Prior to the procedure, a History and Physical                            was performed, and patient medications and                            allergies were reviewed. The patient's tolerance of                            previous anesthesia was also reviewed. The risks                            and benefits of the procedure and the sedation                            options and risks were discussed with the patient.                            All questions were answered, and informed consent                            was obtained. Prior Anticoagulants: The patient has                            taken no previous anticoagulant or antiplatelet                            agents. ASA Grade Assessment: II - A patient with                            mild systemic disease. After reviewing the risks                            and benefits, the patient was deemed in                            satisfactory condition to undergo the procedure.                           After obtaining informed consent, the endoscope was  passed under direct vision. Throughout the                            procedure, the patient's blood pressure, pulse, and                            oxygen saturations were monitored continuously. The                            Endoscope was introduced through the mouth, and                            advanced to the second part of duodenum. The upper                            GI endoscopy was  accomplished without difficulty.                            The patient tolerated the procedure well. Scope In: Scope Out: Findings:                 The esophagus was normal.                           The stomach was normal.                           The examined duodenum was normal. Complications:            No immediate complications. Estimated Blood Loss:     Estimated blood loss: none. Impression:               - Normal esophagus.                           - Normal stomach.                           - Normal examined duodenum.                           - No specimens collected. Recommendation:           - Patient has a contact number available for                            emergencies. The signs and symptoms of potential                            delayed complications were discussed with the                            patient. Return to normal activities tomorrow.                            Written discharge instructions were provided to the  patient.                           - GERD diet.                           - Continue present medications.                           - Follow an antireflux regimen.                           - In addition to diet try Pepcid OTC prn and cabn                            try FD Donald Prose and/or IB Donald Prose                           If this fails to relieve symptoms see me in clinic                           - See the other procedure note for documentation of                            additional recommendations. Gatha Mayer, MD 04/30/2019 2:48:52 PM This report has been signed electronically.

## 2019-04-30 NOTE — Patient Instructions (Addendum)
The esophagus, stomach and duodenum (upper intestine) all look ok.  There was one tiny colon polyp removed and some diverticulosis in the colon.  I will let you know pathology results and when to have another routine colonoscopy by mail and/or My Chart.   Please be sure you are on a reflux diet and you may take some over the counter Pepcid as needed for heartburn and indigestion. There is an over the counter medication called [place] and another called IB Donald Prose and I suggest you try these as well.   If you are still having problems then make an appointment to see me.  I appreciate the opportunity to care for you. Gatha Mayer, MD, [facility]  Please read handouts provided. Continue present medications.Await pathology results. Await pathology results.     YOU HAD AN ENDOSCOPIC PROCEDURE TODAY AT [facility]:   Refer to the procedure report that was given to you for any specific questions about what was found during the examination.  If the procedure report does not answer your questions, please call your gastroenterologist to clarify.  If you requested that your care partner not be given the details of your procedure findings, then the procedure report has been included in a sealed envelope for you to review at your convenience later.  YOU SHOULD EXPECT: Some feelings of bloating in the abdomen. Passage of more gas than usual.  Walking can help get rid of the air that was put into your GI tract during the procedure and reduce the bloating. If you had a lower endoscopy (such as a colonoscopy or flexible sigmoidoscopy) you may notice spotting of blood in your stool or on the toilet paper. If you underwent a bowel prep for your procedure, you may not have a normal bowel movement for a few days.  Please Note:  You might notice some irritation and congestion in your nose or some drainage.  This is from the oxygen used during your procedure.  There is no need for concern and it  should clear up in a day or so.  SYMPTOMS TO REPORT IMMEDIATELY:   Following lower endoscopy (colonoscopy or flexible sigmoidoscopy):  Excessive amounts of blood in the stool  Significant tenderness or worsening of abdominal pains  Swelling of the abdomen that is new, acute  Fever of 100F or higher   Following upper endoscopy (EGD)  Vomiting of blood or coffee ground material  New chest pain or pain under the shoulder blades  Painful or persistently difficult swallowing  New shortness of breath  Fever of 100F or higher  Black, tarry-looking stools  For urgent or emergent issues, a gastroenterologist can be reached at any hour by calling [phone].   DIET:  We do recommend a small meal at first, but then you may proceed to your regular diet.  Drink plenty of fluids but you should avoid alcoholic beverages for 24 hours.  ACTIVITY:  You should plan to take it easy for the rest of today and you should NOT DRIVE or use heavy machinery until tomorrow (because of the sedation medicines used during the test).    FOLLOW UP: Our staff will call the number listed on your records 48-72 hours following your procedure to check on you and address any questions or concerns that you may have regarding the information given to you following your procedure. If we do not reach you, we will leave a message.  We will attempt to reach you two times.  During this call, we will ask if you have developed any symptoms of COVID 19. If you develop any symptoms (ie: fever, flu-like symptoms, shortness of breath, cough etc.) before then, please call [phone].  If you test positive for Covid 19 in the 2 weeks post procedure, please call and report this information to [place].    If any biopsies were taken you will be contacted by phone or by letter within the next 1-3 weeks.  Please call us at [phone] if you have not heard about the biopsies in 3 weeks.    SIGNATURES/CONFIDENTIALITY: You and/or your  care partner have signed paperwork which will be entered into your electronic medical record.  These signatures attest to the fact that that the information above on your After Visit Summary has been reviewed and is understood.  Full responsibility of the confidentiality of this discharge information lies with you and/or your care-partner.

## 2019-05-02 ENCOUNTER — Telehealth: Payer: Self-pay

## 2019-05-02 NOTE — Telephone Encounter (Signed)
First attempt follow up call to pt, lm on vm 

## 2019-05-11 ENCOUNTER — Encounter: Payer: Self-pay | Admitting: Internal Medicine

## 2019-05-11 DIAGNOSIS — Z86010 Personal history of colonic polyps: Secondary | ICD-10-CM

## 2019-05-11 DIAGNOSIS — Z860101 Personal history of adenomatous and serrated colon polyps: Secondary | ICD-10-CM | POA: Insufficient documentation

## 2019-05-11 HISTORY — DX: Personal history of colonic polyps: Z86.010

## 2019-05-11 HISTORY — DX: Personal history of adenomatous and serrated colon polyps: Z86.0101

## 2019-05-11 NOTE — Progress Notes (Signed)
Diminutive adenoma recall 2027

## 2019-07-12 ENCOUNTER — Ambulatory Visit: Payer: Commercial Managed Care - PPO

## 2019-07-20 ENCOUNTER — Ambulatory Visit: Payer: Managed Care, Other (non HMO) | Attending: Internal Medicine

## 2019-07-20 DIAGNOSIS — Z23 Encounter for immunization: Secondary | ICD-10-CM | POA: Insufficient documentation

## 2019-07-20 NOTE — Progress Notes (Signed)
   Covid-19 Vaccination Clinic  Name:  ERMA MONTJOY    MRN: [id number] DOB: Aug 23, 1955  07/20/2019  Ms. Kilty was observed post Covid-19 immunization for 15 minutes without incidence. She was provided with Vaccine Information Sheet and instruction to access the V-Safe system.   Ms. Rauth was instructed to call 911 with any severe reactions post vaccine: Marland Kitchen Difficulty breathing  . Swelling of your face and throat  . A fast heartbeat  . A bad rash all over your body  . Dizziness and weakness    Immunizations Administered    Name Date Dose VIS Date Route   Pfizer COVID-19 Vaccine 07/20/2019  3:51 PM 0.3 mL 05/25/2019 Intramuscular   Manufacturer: [place]   Lot: [id number]   [place]: [id number]

## 2019-08-02 ENCOUNTER — Ambulatory Visit: Payer: Commercial Managed Care - PPO

## 2019-08-15 ENCOUNTER — Ambulatory Visit: Payer: Managed Care, Other (non HMO) | Attending: Internal Medicine

## 2019-08-15 DIAGNOSIS — Z23 Encounter for immunization: Secondary | ICD-10-CM

## 2019-08-15 NOTE — Progress Notes (Signed)
   Covid-19 Vaccination Clinic  Name:  [name]    MRN: [id number] DOB: 1955-10-20  08/15/2019  [name] was observed post Covid-19 immunization for 15 minutes without incident. She was provided with Vaccine Information Sheet and instruction to access the V-Safe system.   [name] was instructed to call 911 with any severe reactions post vaccine: Marland Kitchen Difficulty breathing  . Swelling of face and throat  . A fast heartbeat  . A bad rash all over body  . Dizziness and weakness   Immunizations Administered    Name Date Dose VIS Date Route   Pfizer COVID-19 Vaccine 08/15/2019  8:17 AM 0.3 mL 05/25/2019 Intramuscular   Manufacturer: [place]    Lot: [id number]   [place]: [id number]

## 2019-10-08 NOTE — Progress Notes (Deleted)
   Primary Physician/Referring:  [name], No Pcp Per  [name] ID: JACQUELYN ENGEBRETSON, female    DOB: [birth year], 64 y.o.   MRN: [id number]  No chief complaint on file.  HPI:    SHAZA WISNEWSKI  is a 64 y.o. ***  Past Medical History:  Diagnosis Date  . Allergy   . Anxiety    situational due to COVID  . Arthritis   . Bloating   . Constipation   . GERD (gastroesophageal reflux disease)   . Hx of adenomatous polyp of colon 05/11/2019  . Nausea    in the mornings   . Osteoporosis    Past Surgical History:  Procedure Laterality Date  . COLONOSCOPY    . UPPER GASTROINTESTINAL ENDOSCOPY     Family History  Problem Relation Age of Onset  . Arthritis Mother   . Hypertension Mother   . Colon polyps Other   . Ovarian cancer Cousin   . Colon cancer Neg Hx   . Esophageal cancer Neg Hx   . Rectal cancer Neg Hx   . Stomach cancer Neg Hx     Social History   Tobacco Use  . Smoking status: Never Smoker  . Smokeless tobacco: Never Used  Substance Use Topics  . Alcohol use: No   Marital Status: Married  ROS  ***ROS Objective  There were no vitals taken for this visit.  Vitals with BMI 04/30/2019 04/30/2019 04/30/2019  Height - - -  Weight - - -  BMI - - -  Systolic [id number]  Diastolic 85 77 89  Pulse 75 83 90     ***Physical Exam Laboratory examination:   No results for input(s): NA, K, CL, CO2, GLUCOSE, BUN, CREATININE, CALCIUM, GFRNONAA, GFRAA in the last 8760 hours. CrCl cannot be calculated (No successful lab value found.).  No flowsheet data found. CBC Latest Ref Rng & Units 06/10/2012  WBC 4.6 - 10.2 K/uL 12.2(A)  Hemoglobin 12.2 - 16.2 g/dL 13.2  Hematocrit 37.7 - 47.9 % 42.6   Lipid Panel  No results found for: CHOL, TRIG, HDL, CHOLHDL, VLDL, LDLCALC, LDLDIRECT HEMOGLOBIN A1C No results found for: HGBA1C, MPG TSH No results for input(s): TSH in the last 8760 hours.  External labs:   ***  Medications and allergies  No Known Allergies   Current Outpatient  Medications  Medication Instructions  . calcium carbonate 250 mg, Oral, Daily  . cholecalciferol (VITAMIN D) 1,000 Units, Oral, Daily  . fish oil-omega-3 fatty acids 2 g, Daily  . glucosamine-chondroitin 500-400 MG tablet 1 tablet, Oral, Daily  . Multiple Vitamins-Minerals (MULTIPLE VITAMINS/WOMENS PO) 1 tablet, Oral, Daily  . Red Yeast Rice Extract 1,200 mg, Oral, Daily   Radiology:   No results found.  Cardiac Studies:   *** EKG  ***  ***  Assessment  No diagnosis found.   No orders of the defined types were placed in this encounter.   There are no discontinued medications.  Recommendations:   ***  Adrian Prows, MD, [facility] 10/08/2019, 3:12 PM [facility] [place] Pager: [phone] Office: [phone] If no answer Cell [phone]

## 2019-10-10 ENCOUNTER — Ambulatory Visit: Payer: Self-pay | Admitting: Cardiology

## 2019-10-25 NOTE — Progress Notes (Signed)
Primary Physician/Referring:  Kathyrn Lass, MD  Patient ID: [name], female    DOB: 1955-09-01, 64 y.o.   MRN: [id number]  Chief Complaint  Patient presents with  . Neuro Sx  . Follow-up  . Atrial Fibrillation   HPI:    [name]  is a 64 y.o. Asian female patient who I had seen in March 2019 for presents for palpitations and shortness of breath.  At that time I had felt her symptoms were related to occasional PVCs and PACs.  She is now referred to me by Dr. Kathyrn Lass to evaluate for possible atrial fibrillation in view of headaches associated with visual disturbances.  She has had complete resolution of palpitations since I last saw her in 2019.  Describes occasional severe sharp headaches involving her scalp on either sides, associated with blurred vision and last very brief moments.  She has been scheduled for MRI but could not complete it due to claustrophobia.  She now been rescheduled.  Otherwise asymptomatic without chest pain, dizziness or syncope.  There is no history of hypertension or hyperlipidemia or diabetes mellitus.   Past Medical History:  Diagnosis Date  . Allergy   . Anxiety    situational due to COVID  . Arthritis   . Bloating   . Constipation   . GERD (gastroesophageal reflux disease)   . Hx of adenomatous polyp of colon 05/11/2019  . Nausea    in the mornings   . Osteoporosis    Past Surgical History:  Procedure Laterality Date  . COLONOSCOPY    . UPPER GASTROINTESTINAL ENDOSCOPY     Family History  Problem Relation Age of Onset  . Arthritis Mother   . Hypertension Mother   . Colon polyps Other   . Ovarian cancer Cousin   . Colon cancer Neg Hx   . Esophageal cancer Neg Hx   . Rectal cancer Neg Hx   . Stomach cancer Neg Hx     Social History   Tobacco Use  . Smoking status: Never Smoker  . Smokeless tobacco: Never Used  Substance Use Topics  . Alcohol use: No   Marital Status: Married  ROS  Review of Systems  Cardiovascular:  Negative for chest pain, dyspnea on exertion and leg swelling.  Gastrointestinal: Negative for melena.   Objective  Blood pressure 117/76, pulse 68, temperature 98.4 F (36.9 C), temperature source Temporal, resp. rate 16, height 5' (1.524 m), weight 140 lb (63.5 kg), SpO2 99 %.  Vitals with BMI 10/26/2019 04/30/2019 04/30/2019  Height '5\' 0"'  - -  Weight 140 lbs - -  BMI 40.98 - -  Systolic 119 147 829  Diastolic 76 85 77  Pulse 68 75 83     Physical Exam  Constitutional:  Short stature and well built in no distress  Cardiovascular: Normal rate, regular rhythm, normal heart sounds and intact distal pulses. Exam reveals no gallop.  No murmur heard. No leg edema, no JVD.  Pulmonary/Chest: Effort normal and breath sounds normal.  Abdominal: Soft. Bowel sounds are normal.   Laboratory examination:   No results for input(s): NA, K, CL, CO2, GLUCOSE, BUN, CREATININE, CALCIUM, GFRNONAA, GFRAA in the last 8760 hours. CrCl cannot be calculated (No successful lab value found.).  No flowsheet data found. CBC Latest Ref Rng & Units 06/10/2012  WBC 4.6 - 10.2 K/uL 12.2(A)  Hemoglobin 12.2 - 16.2 g/dL 13.2  Hematocrit 37.7 - 47.9 % 42.6   Lipid Panel  No results  found for: CHOL, TRIG, HDL, CHOLHDL, VLDL, LDLCALC, LDLDIRECT HEMOGLOBIN A1C No results found for: HGBA1C, MPG TSH No results for input(s): TSH in the last 8760 hours.  External labs:   Nothing recent found  07/04/2017: CBC normal.  Creatinine 0.65, EGFR 92/111, potassium 4.7, CMP normal.  TSH 0.9.  D-dimer 0.4.  Troponin negative.  Cholesterol 200, triglycerides 174, HDL 54, LDL 113.  Medications and allergies  No Known Allergies   Current Outpatient Medications  Medication Instructions  . ASPIRIN 81 PO 1 tablet, Oral, Daily  . calcium carbonate 250 mg, Oral, Daily  . cholecalciferol (VITAMIN D) 1,000 Units, Oral, Daily  . fish oil-omega-3 fatty acids 2 g, Daily  . glucosamine-chondroitin 500-400 MG tablet 1 tablet,  Oral, Daily  . Multiple Vitamins-Minerals (MULTIPLE VITAMINS/WOMENS PO) 1 tablet, Oral, Daily   Radiology:   No results found.  Cardiac Studies:   Treadmill exercise stress test 08/01/2017: Indication: SOB The patient exercised on Bruce protocol for 07:31 min. Patient achieved 9.40 METS and reached HR 169 bpm, which is 107 % of maximum age-predicted HR. Stress test terminated due to fatigue. Resting BP 150/82, peak BP 198/98 mm Hg. HR Response to Exercise: Appropriate. Arrhythmias: none. ST Changes: With peak exercise there was no ST-T changes of ischemia. Exercise capacity was below average for age. Overall Impression: Normal stress test. Continue primary/secondary prevention.  Echocardiogram 08/02/2017: Left ventricle cavity is normal in size. Normal global wall motion. Doppler evidence of grade I (impaired) diastolic dysfunction, normal LAP. Calculated EF 63%. Mild to moderate mitral regurgitation. Mild tricuspid regurgitation. No evidence of pulmonary hypertension.  EKG  EKG 10/26/2019: Normal sinus rhythm with rate of 65 bpm, normal axis, no evidence of ischemia, normal EKG. no significant change from 07/04/2017.   Assessment     ICD-10-CM   1. Intermittent palpitations  R00.2 EKG 12-Lead  2. Blurred vision  H53.8   3. Nonintractable episodic headache, unspecified headache type  R51.9      No orders of the defined types were placed in this encounter.   Medications Discontinued During This Encounter  Medication Reason  . Red Yeast Rice Extract 600 MG CAPS Patient Preference    Recommendations:   [name]  is a 64 y.o. Asian female patient who I had seen in March 2019 for presents for palpitations and shortness of breath.  At that time I had felt her symptoms were related to occasional PVCs and PACs.  She is now referred to me by Dr. Kathyrn Lass to evaluate for possible atrial fibrillation in view of headaches associated with visual disturbances.  After review of her  symptoms, I do not suspect she has atrial fibrillation or cardioembolic phenomena, also she was extremely symptomatic previously with PACs and PVCs and I suppose if she did have episodes of atrial fibrillation that is sustained in persistent she probably would have felt some symptoms.  Also transient phenomenon of severe headache followed by visual disturbances lasting a few seconds appears unlikely to be TIA and may be a variant of migraine.  Patient has been scheduled for MRI of the brain, unless this reveals embolic phenomena or vasculitis-like phenomena, would not pursue cardiac etiology.  I have reassured the patient.  I will be happy to see her back on a as needed basis. Adrian Prows, MD, [facility] 10/26/2019, 1:23 PM [facility] [place] Office: [phone]

## 2019-10-26 ENCOUNTER — Ambulatory Visit: Payer: Managed Care, Other (non HMO) | Admitting: Cardiology

## 2019-10-26 ENCOUNTER — Other Ambulatory Visit: Payer: Self-pay

## 2019-10-26 ENCOUNTER — Encounter: Payer: Self-pay | Admitting: Cardiology

## 2019-10-26 VITALS — BP 117/76 | HR 68 | Temp 98.4°F | Resp 16 | Ht 60.0 in | Wt 140.0 lb

## 2019-10-26 DIAGNOSIS — R519 Headache, unspecified: Secondary | ICD-10-CM

## 2019-10-26 DIAGNOSIS — R002 Palpitations: Secondary | ICD-10-CM

## 2019-10-26 DIAGNOSIS — H538 Other visual disturbances: Secondary | ICD-10-CM

## 2020-04-23 ENCOUNTER — Other Ambulatory Visit: Payer: Self-pay | Admitting: Family Medicine

## 2020-04-23 DIAGNOSIS — M8580 Other specified disorders of bone density and structure, unspecified site: Secondary | ICD-10-CM

## 2020-09-04 ENCOUNTER — Ambulatory Visit
Admission: RE | Admit: 2020-09-04 | Discharge: 2020-09-04 | Disposition: A | Discharge status: Home / Self Care | Payer: Managed Care, Other (non HMO) | Source: Ambulatory Visit | Attending: Family Medicine | Admitting: Family Medicine

## 2020-09-04 ENCOUNTER — Other Ambulatory Visit: Payer: Self-pay

## 2020-09-04 DIAGNOSIS — M8580 Other specified disorders of bone density and structure, unspecified site: Secondary | ICD-10-CM

## 2020-09-05 ENCOUNTER — Other Ambulatory Visit: Payer: Managed Care, Other (non HMO)

## 2021-04-28 ENCOUNTER — Other Ambulatory Visit: Payer: Self-pay | Admitting: Family Medicine

## 2021-04-28 DIAGNOSIS — E7800 Pure hypercholesterolemia, unspecified: Secondary | ICD-10-CM

## 2021-05-28 ENCOUNTER — Ambulatory Visit
Admission: RE | Admit: 2021-05-28 | Discharge: 2021-05-28 | Disposition: A | Discharge status: Home / Self Care | Payer: No Typology Code available for payment source | Source: Ambulatory Visit | Attending: Family Medicine | Admitting: Family Medicine

## 2021-05-28 DIAGNOSIS — E7800 Pure hypercholesterolemia, unspecified: Secondary | ICD-10-CM

## 2021-09-10 ENCOUNTER — Observation Stay (HOSPITAL_COMMUNITY)
Admission: EM | Admit: 2021-09-10 | Discharge: 2021-09-12 | Disposition: A | Discharge status: Home / Self Care | Payer: Managed Care, Other (non HMO)

## 2021-09-10 ENCOUNTER — Encounter (HOSPITAL_COMMUNITY): Payer: Self-pay | Admitting: Emergency Medicine

## 2021-09-10 ENCOUNTER — Other Ambulatory Visit: Payer: Self-pay

## 2021-09-10 DIAGNOSIS — E785 Hyperlipidemia, unspecified: Secondary | ICD-10-CM

## 2021-09-10 DIAGNOSIS — K8012 Calculus of gallbladder with acute and chronic cholecystitis without obstruction: Principal | ICD-10-CM | POA: Insufficient documentation

## 2021-09-10 DIAGNOSIS — R1013 Epigastric pain: Secondary | ICD-10-CM | POA: Diagnosis present

## 2021-09-10 DIAGNOSIS — K219 Gastro-esophageal reflux disease without esophagitis: Secondary | ICD-10-CM | POA: Diagnosis not present

## 2021-09-10 DIAGNOSIS — K8050 Calculus of bile duct without cholangitis or cholecystitis without obstruction: Principal | ICD-10-CM

## 2021-09-10 DIAGNOSIS — R030 Elevated blood-pressure reading, without diagnosis of hypertension: Secondary | ICD-10-CM

## 2021-09-10 LAB — URINALYSIS, ROUTINE W REFLEX MICROSCOPIC
Bilirubin Urine: NEGATIVE
Glucose, UA: NEGATIVE mg/dL
Ketones, ur: 5 mg/dL — AB
Leukocytes,Ua: NEGATIVE
Nitrite: NEGATIVE
Protein, ur: NEGATIVE mg/dL
Specific Gravity, Urine: 1.018 (ref 1.005–1.030)
pH: 6 (ref 5.0–8.0)

## 2021-09-10 LAB — COMPREHENSIVE METABOLIC PANEL
ALT: 606 U/L — ABNORMAL HIGH (ref 0–44)
AST: 469 U/L — ABNORMAL HIGH (ref 15–41)
Albumin: 3.9 g/dL (ref 3.5–5.0)
Alkaline Phosphatase: 155 U/L — ABNORMAL HIGH (ref 38–126)
Anion gap: 9 (ref 5–15)
BUN: 15 mg/dL (ref 8–23)
CO2: 28 mmol/L (ref 22–32)
Calcium: 9.7 mg/dL (ref 8.9–10.3)
Chloride: 104 mmol/L (ref 98–111)
Creatinine, Ser: 0.95 mg/dL (ref 0.44–1.00)
GFR, Estimated: >60 mL/min (ref >60)
Glucose, Bld: 149 mg/dL — ABNORMAL HIGH (ref 70–99)
Potassium: 4 mmol/L (ref 3.5–5.1)
Sodium: 141 mmol/L (ref 135–145)
Total Bilirubin: 2.6 mg/dL — ABNORMAL HIGH (ref 0.3–1.2)
Total Protein: 7.2 g/dL (ref 6.5–8.1)

## 2021-09-10 LAB — CBC
HCT: 41.5 % (ref 36.0–46.0)
Hemoglobin: 14.1 g/dL (ref 12.0–15.0)
MCH: 31.1 pg (ref 26.0–34.0)
MCHC: 34 g/dL (ref 30.0–36.0)
MCV: 91.4 fL (ref 80.0–100.0)
Platelets: 302 10*3/uL (ref 150–400)
RBC: 4.54 MIL/uL (ref 3.87–5.11)
RDW: 12.9 % (ref 11.5–15.5)
WBC: 10.1 10*3/uL (ref 4.0–10.5)
nRBC: 0 % (ref 0.0–0.2)

## 2021-09-10 LAB — LIPASE, BLOOD: Lipase: 36 U/L (ref 11–51)

## 2021-09-10 MED ORDER — SODIUM CHLORIDE 0.9 % IV BOLUS
1000.0000 mL | Freq: Once | INTRAVENOUS | Status: AC
Start: 2021-09-11 — End: 2021-09-11
  Administered 2021-09-11: 1000 mL via INTRAVENOUS

## 2021-09-10 MED ORDER — HYDROMORPHONE HCL 1 MG/ML IJ SOLN
0.5000 mg | Freq: Once | INTRAMUSCULAR | Status: AC
  Administered 2021-09-11: 0.5 mg via INTRAVENOUS
  Filled 2021-09-10: qty 1

## 2021-09-10 NOTE — ED Notes (Signed)
Dr Bero at bedside.  

## 2021-09-10 NOTE — ED Triage Notes (Addendum)
Pt c/o abdominal pain, N/V, and back pain since yesterday. Pt states that she has hx of gallstones ? ?

## 2021-09-11 ENCOUNTER — Encounter (HOSPITAL_COMMUNITY): Payer: Self-pay

## 2021-09-11 ENCOUNTER — Inpatient Hospital Stay (HOSPITAL_BASED_OUTPATIENT_CLINIC_OR_DEPARTMENT_OTHER): Payer: Managed Care, Other (non HMO) | Admitting: Certified Registered Nurse Anesthetist

## 2021-09-11 ENCOUNTER — Ambulatory Visit: Payer: Managed Care, Other (non HMO) | Admitting: Gastroenterology

## 2021-09-11 ENCOUNTER — Emergency Department (HOSPITAL_COMMUNITY): Payer: Managed Care, Other (non HMO)

## 2021-09-11 ENCOUNTER — Telehealth: Payer: Self-pay | Admitting: Gastroenterology

## 2021-09-11 ENCOUNTER — Inpatient Hospital Stay (HOSPITAL_COMMUNITY): Payer: Managed Care, Other (non HMO) | Admitting: Certified Registered Nurse Anesthetist

## 2021-09-11 ENCOUNTER — Encounter (HOSPITAL_COMMUNITY): Admission: EM | Disposition: A | Discharge status: Home / Self Care | Payer: Self-pay | Source: Home / Self Care

## 2021-09-11 ENCOUNTER — Other Ambulatory Visit: Payer: Self-pay

## 2021-09-11 ENCOUNTER — Inpatient Hospital Stay (HOSPITAL_COMMUNITY): Payer: Managed Care, Other (non HMO)

## 2021-09-11 DIAGNOSIS — R1013 Epigastric pain: Secondary | ICD-10-CM | POA: Diagnosis not present

## 2021-09-11 DIAGNOSIS — F419 Anxiety disorder, unspecified: Secondary | ICD-10-CM | POA: Diagnosis not present

## 2021-09-11 DIAGNOSIS — K819 Cholecystitis, unspecified: Secondary | ICD-10-CM | POA: Diagnosis not present

## 2021-09-11 DIAGNOSIS — E785 Hyperlipidemia, unspecified: Secondary | ICD-10-CM

## 2021-09-11 DIAGNOSIS — R030 Elevated blood-pressure reading, without diagnosis of hypertension: Secondary | ICD-10-CM

## 2021-09-11 DIAGNOSIS — M1990 Unspecified osteoarthritis, unspecified site: Secondary | ICD-10-CM

## 2021-09-11 HISTORY — PX: CHOLECYSTECTOMY: SHX55

## 2021-09-11 LAB — COMPREHENSIVE METABOLIC PANEL
ALT: 538 U/L — ABNORMAL HIGH (ref 0–44)
AST: 325 U/L — ABNORMAL HIGH (ref 15–41)
Albumin: 3.2 g/dL — ABNORMAL LOW (ref 3.5–5.0)
Alkaline Phosphatase: 141 U/L — ABNORMAL HIGH (ref 38–126)
Anion gap: 9 (ref 5–15)
BUN: 10 mg/dL (ref 8–23)
CO2: 24 mmol/L (ref 22–32)
Calcium: 8.5 mg/dL — ABNORMAL LOW (ref 8.9–10.3)
Chloride: 104 mmol/L (ref 98–111)
Creatinine, Ser: 0.72 mg/dL (ref 0.44–1.00)
GFR, Estimated: >60 mL/min (ref >60)
Glucose, Bld: 98 mg/dL (ref 70–99)
Potassium: 3.6 mmol/L (ref 3.5–5.1)
Sodium: 137 mmol/L (ref 135–145)
Total Bilirubin: 1.6 mg/dL — ABNORMAL HIGH (ref 0.3–1.2)
Total Protein: 6.4 g/dL — ABNORMAL LOW (ref 6.5–8.1)

## 2021-09-11 LAB — PROTIME-INR
INR: 1 (ref 0.8–1.2)
Prothrombin Time: 13 seconds (ref 11.4–15.2)

## 2021-09-11 LAB — HIV ANTIBODY (ROUTINE TESTING W REFLEX): HIV Screen 4th Generation wRfx: NONREACTIVE

## 2021-09-11 SURGERY — LAPAROSCOPIC CHOLECYSTECTOMY WITH INTRAOPERATIVE CHOLANGIOGRAM
Anesthesia: General | Site: Abdomen

## 2021-09-11 MED ORDER — HYDROMORPHONE HCL 1 MG/ML IJ SOLN: 0.5000 mg | INTRAMUSCULAR | Status: DC | PRN

## 2021-09-11 MED ORDER — SODIUM CHLORIDE 0.9 % IV SOLN
2.0000 g | Freq: Once | INTRAVENOUS | Status: AC
  Administered 2021-09-11: 2 g via INTRAVENOUS
  Filled 2021-09-11: qty 20

## 2021-09-11 MED ORDER — LIDOCAINE 2% (20 MG/ML) 5 ML SYRINGE
INTRAMUSCULAR | Status: DC | PRN
  Administered 2021-09-11: 60 mg via INTRAVENOUS

## 2021-09-11 MED ORDER — CHLORHEXIDINE GLUCONATE 0.12 % MT SOLN
OROMUCOSAL | Status: AC
  Administered 2021-09-11: 15 mL via OROMUCOSAL
  Filled 2021-09-11: qty 15

## 2021-09-11 MED ORDER — IOPAMIDOL (ISOVUE-300) INJECTION 61%
INTRAVENOUS | Status: DC | PRN
  Administered 2021-09-11: 100 mL

## 2021-09-11 MED ORDER — ESMOLOL HCL 100 MG/10ML IV SOLN
INTRAVENOUS | Status: AC
  Filled 2021-09-11: qty 10

## 2021-09-11 MED ORDER — LACTATED RINGERS IV SOLN: INTRAVENOUS | Status: DC

## 2021-09-11 MED ORDER — ENOXAPARIN SODIUM 40 MG/0.4ML IJ SOSY
40.0000 mg | PREFILLED_SYRINGE | INTRAMUSCULAR | Status: DC
Start: 2021-09-12 — End: 2021-09-12
  Filled 2021-09-11: qty 0.4

## 2021-09-11 MED ORDER — BUPIVACAINE-EPINEPHRINE (PF) 0.25% -1:200000 IJ SOLN
INTRAMUSCULAR | Status: AC
  Filled 2021-09-11: qty 30

## 2021-09-11 MED ORDER — MIDAZOLAM HCL 2 MG/2ML IJ SOLN
INTRAMUSCULAR | Status: AC
  Filled 2021-09-11: qty 2

## 2021-09-11 MED ORDER — DROPERIDOL 2.5 MG/ML IJ SOLN
0.6250 mg | Freq: Once | INTRAMUSCULAR | Status: AC | PRN
  Administered 2021-09-11: 0.625 mg via INTRAVENOUS

## 2021-09-11 MED ORDER — FENTANYL CITRATE (PF) 100 MCG/2ML IJ SOLN
25.0000 ug | INTRAMUSCULAR | Status: DC | PRN
  Administered 2021-09-11: 25 ug via INTRAVENOUS

## 2021-09-11 MED ORDER — ROCURONIUM BROMIDE 10 MG/ML (PF) SYRINGE
PREFILLED_SYRINGE | INTRAVENOUS | Status: AC
  Filled 2021-09-11: qty 10

## 2021-09-11 MED ORDER — DROPERIDOL 2.5 MG/ML IJ SOLN
INTRAMUSCULAR | Status: AC
  Filled 2021-09-11: qty 2

## 2021-09-11 MED ORDER — ROCURONIUM BROMIDE 10 MG/ML (PF) SYRINGE
PREFILLED_SYRINGE | INTRAVENOUS | Status: DC | PRN
  Administered 2021-09-11: 40 mg via INTRAVENOUS

## 2021-09-11 MED ORDER — ORAL CARE MOUTH RINSE: 15.0000 mL | Freq: Once | OROMUCOSAL | Status: AC

## 2021-09-11 MED ORDER — PROPOFOL 10 MG/ML IV BOLUS
INTRAVENOUS | Status: DC | PRN
  Administered 2021-09-11: 120 mg via INTRAVENOUS

## 2021-09-11 MED ORDER — ACETAMINOPHEN 500 MG PO TABS
1000.0000 mg | ORAL_TABLET | Freq: Four times a day (QID) | ORAL | Status: DC
Start: 2021-09-11 — End: 2021-09-12
  Administered 2021-09-11 – 2021-09-12 (×3): 1000 mg via ORAL
  Filled 2021-09-11 (×3): qty 2

## 2021-09-11 MED ORDER — KETOROLAC TROMETHAMINE 30 MG/ML IJ SOLN
INTRAMUSCULAR | Status: DC | PRN
  Administered 2021-09-11: 30 mg via INTRAVENOUS

## 2021-09-11 MED ORDER — OXYCODONE HCL 5 MG PO TABS: 5.0000 mg | ORAL_TABLET | Freq: Once | ORAL | Status: DC | PRN

## 2021-09-11 MED ORDER — LABETALOL HCL 5 MG/ML IV SOLN
INTRAVENOUS | Status: AC
  Filled 2021-09-11: qty 4

## 2021-09-11 MED ORDER — LABETALOL HCL 5 MG/ML IV SOLN
INTRAVENOUS | Status: DC | PRN
  Administered 2021-09-11 (×2): 5 mg via INTRAVENOUS

## 2021-09-11 MED ORDER — OXYCODONE HCL 5 MG/5ML PO SOLN: 5.0000 mg | Freq: Once | ORAL | Status: DC | PRN

## 2021-09-11 MED ORDER — PHENYLEPHRINE 40 MCG/ML (10ML) SYRINGE FOR IV PUSH (FOR BLOOD PRESSURE SUPPORT)
PREFILLED_SYRINGE | INTRAVENOUS | Status: DC | PRN
  Administered 2021-09-11: 80 ug via INTRAVENOUS

## 2021-09-11 MED ORDER — BUPIVACAINE-EPINEPHRINE 0.25% -1:200000 IJ SOLN
INTRAMUSCULAR | Status: DC | PRN
  Administered 2021-09-11: 10 mL

## 2021-09-11 MED ORDER — SODIUM CHLORIDE 0.9 % IV SOLN: INTRAVENOUS | Status: AC

## 2021-09-11 MED ORDER — METRONIDAZOLE 500 MG/100ML IV SOLN
500.0000 mg | Freq: Two times a day (BID) | INTRAVENOUS | Status: DC
  Administered 2021-09-11: 500 mg via INTRAVENOUS
  Filled 2021-09-11: qty 100

## 2021-09-11 MED ORDER — KETOROLAC TROMETHAMINE 30 MG/ML IJ SOLN
INTRAMUSCULAR | Status: AC
  Filled 2021-09-11: qty 1

## 2021-09-11 MED ORDER — ESMOLOL HCL 100 MG/10ML IV SOLN
INTRAVENOUS | Status: DC | PRN
  Administered 2021-09-11: 20 mg via INTRAVENOUS
  Administered 2021-09-11 (×2): 30 mg via INTRAVENOUS

## 2021-09-11 MED ORDER — DEXAMETHASONE SODIUM PHOSPHATE 10 MG/ML IJ SOLN
INTRAMUSCULAR | Status: DC | PRN
  Administered 2021-09-11: 4 mg via INTRAVENOUS

## 2021-09-11 MED ORDER — CHLORHEXIDINE GLUCONATE 0.12 % MT SOLN
15.0000 mL | Freq: Once | OROMUCOSAL | Status: AC
  Filled 2021-09-11 (×2): qty 15

## 2021-09-11 MED ORDER — PHENYLEPHRINE 40 MCG/ML (10ML) SYRINGE FOR IV PUSH (FOR BLOOD PRESSURE SUPPORT)
PREFILLED_SYRINGE | INTRAVENOUS | Status: AC
  Filled 2021-09-11: qty 10

## 2021-09-11 MED ORDER — FENTANYL CITRATE (PF) 250 MCG/5ML IJ SOLN
INTRAMUSCULAR | Status: AC
  Filled 2021-09-11: qty 5

## 2021-09-11 MED ORDER — OXYCODONE HCL 5 MG PO TABS: 5.0000 mg | ORAL_TABLET | ORAL | Status: DC | PRN

## 2021-09-11 MED ORDER — SODIUM CHLORIDE 0.9 % IR SOLN
Status: DC | PRN
Start: 2021-09-11 — End: 2021-09-11
  Administered 2021-09-11: 1000 mL

## 2021-09-11 MED ORDER — ONDANSETRON HCL 4 MG/2ML IJ SOLN
INTRAMUSCULAR | Status: AC
  Filled 2021-09-11: qty 2

## 2021-09-11 MED ORDER — DEXAMETHASONE SODIUM PHOSPHATE 10 MG/ML IJ SOLN
INTRAMUSCULAR | Status: AC
  Filled 2021-09-11: qty 1

## 2021-09-11 MED ORDER — FENTANYL CITRATE (PF) 100 MCG/2ML IJ SOLN
INTRAMUSCULAR | Status: AC
  Filled 2021-09-11: qty 2

## 2021-09-11 MED ORDER — FENTANYL CITRATE (PF) 250 MCG/5ML IJ SOLN
INTRAMUSCULAR | Status: DC | PRN
  Administered 2021-09-11 (×2): 50 ug via INTRAVENOUS
  Administered 2021-09-11: 25 ug via INTRAVENOUS
  Administered 2021-09-11: 50 ug via INTRAVENOUS
  Administered 2021-09-11: 75 ug via INTRAVENOUS

## 2021-09-11 MED ORDER — ONDANSETRON HCL 4 MG/2ML IJ SOLN
INTRAMUSCULAR | Status: DC | PRN
  Administered 2021-09-11: 4 mg via INTRAVENOUS

## 2021-09-11 MED ORDER — LIDOCAINE 2% (20 MG/ML) 5 ML SYRINGE
INTRAMUSCULAR | Status: AC
  Filled 2021-09-11: qty 5

## 2021-09-11 MED ORDER — 0.9 % SODIUM CHLORIDE (POUR BTL) OPTIME
TOPICAL | Status: DC | PRN
  Administered 2021-09-11: 1000 mL

## 2021-09-11 MED ORDER — PROPOFOL 10 MG/ML IV BOLUS
INTRAVENOUS | Status: AC
  Filled 2021-09-11: qty 20

## 2021-09-11 MED ORDER — MIDAZOLAM HCL 2 MG/2ML IJ SOLN
INTRAMUSCULAR | Status: DC | PRN
  Administered 2021-09-11: 1 mg via INTRAVENOUS

## 2021-09-11 MED ORDER — IOHEXOL 300 MG/ML  SOLN
80.0000 mL | Freq: Once | INTRAMUSCULAR | Status: AC | PRN
  Administered 2021-09-11: 80 mL via INTRAVENOUS

## 2021-09-11 SURGICAL SUPPLY — 46 items
ADH SKN CLS APL DERMABOND .7 (GAUZE/BANDAGES/DRESSINGS) ×1
APL PRP STRL LF DISP 70% ISPRP (MISCELLANEOUS) ×1
APPLIER CLIP 5 13 M/L LIGAMAX5 (MISCELLANEOUS) ×2
APR CLP MED LRG 5 ANG JAW (MISCELLANEOUS) ×1
BAG SPEC RTRVL 10 TROC 200 (ENDOMECHANICALS) ×1
BLADE CLIPPER SURG (BLADE) IMPLANT
CANISTER SUCT 3000ML PPV (MISCELLANEOUS) ×2 IMPLANT
CHLORAPREP W/TINT 26 (MISCELLANEOUS) ×2 IMPLANT
CLIP APPLIE 5 13 M/L LIGAMAX5 (MISCELLANEOUS) ×1 IMPLANT
COVER MAYO STAND STRL (DRAPES) ×2 IMPLANT
COVER SURGICAL LIGHT HANDLE (MISCELLANEOUS) ×2 IMPLANT
DERMABOND ADVANCED (GAUZE/BANDAGES/DRESSINGS) ×1
DERMABOND ADVANCED .7 DNX12 (GAUZE/BANDAGES/DRESSINGS) ×1 IMPLANT
DRAPE C-ARM 42X120 X-RAY (DRAPES) ×2 IMPLANT
ELECT REM PT RETURN 9FT ADLT (ELECTROSURGICAL) ×2
ELECTRODE REM PT RTRN 9FT ADLT (ELECTROSURGICAL) ×1 IMPLANT
GLOVE SRG 8 PF TXTR STRL LF DI (GLOVE) ×1 IMPLANT
GLOVE SURG ENC MOIS LTX SZ8 (GLOVE) ×2 IMPLANT
GLOVE SURG UNDER POLY LF SZ8 (GLOVE) ×2
GOWN STRL REUS W/ TWL LRG LVL3 (GOWN DISPOSABLE) ×2 IMPLANT
GOWN STRL REUS W/ TWL XL LVL3 (GOWN DISPOSABLE) ×1 IMPLANT
GOWN STRL REUS W/TWL LRG LVL3 (GOWN DISPOSABLE) ×4
GOWN STRL REUS W/TWL XL LVL3 (GOWN DISPOSABLE) ×2
KIT BASIN OR (CUSTOM PROCEDURE TRAY) ×2 IMPLANT
KIT TURNOVER KIT B (KITS) ×2 IMPLANT
L-HOOK LAP DISP 36CM (ELECTROSURGICAL) ×2
LHOOK LAP DISP 36CM (ELECTROSURGICAL) ×1 IMPLANT
NEEDLE 22X1 1/2 (OR ONLY) (NEEDLE) ×2 IMPLANT
NS IRRIG 1000ML POUR BTL (IV SOLUTION) ×2 IMPLANT
PAD ARMBOARD 7.5X6 YLW CONV (MISCELLANEOUS) ×2 IMPLANT
PENCIL BUTTON HOLSTER BLD 10FT (ELECTRODE) ×2 IMPLANT
POUCH RETRIEVAL ECOSAC 10 (ENDOMECHANICALS) ×1 IMPLANT
POUCH RETRIEVAL ECOSAC 10MM (ENDOMECHANICALS) ×2
SCISSORS LAP 5X35 DISP (ENDOMECHANICALS) ×2 IMPLANT
SET CHOLANGIOGRAPH 5 50 .035 (SET/KITS/TRAYS/PACK) ×2 IMPLANT
SET IRRIG TUBING LAPAROSCOPIC (IRRIGATION / IRRIGATOR) ×2 IMPLANT
SET TUBE SMOKE EVAC HIGH FLOW (TUBING) ×2 IMPLANT
SLEEVE ENDOPATH XCEL 5M (ENDOMECHANICALS) ×4 IMPLANT
SPECIMEN JAR SMALL (MISCELLANEOUS) ×2 IMPLANT
SUT VIC AB 4-0 PS2 27 (SUTURE) ×2 IMPLANT
TOWEL GREEN STERILE (TOWEL DISPOSABLE) ×2 IMPLANT
TOWEL GREEN STERILE FF (TOWEL DISPOSABLE) ×2 IMPLANT
TRAY LAPAROSCOPIC MC (CUSTOM PROCEDURE TRAY) ×2 IMPLANT
TROCAR XCEL BLUNT TIP 100MML (ENDOMECHANICALS) ×2 IMPLANT
TROCAR XCEL NON-BLD 5MMX100MML (ENDOMECHANICALS) ×2 IMPLANT
WATER STERILE IRR 1000ML POUR (IV SOLUTION) ×2 IMPLANT

## 2021-09-11 NOTE — Transfer of Care (Signed)
Immediate Anesthesia Transfer of Care Note ? ?Patient: [name] ? ?Procedure(s) Performed: LAPAROSCOPIC CHOLECYSTECTOMY WITH INTRAOPERATIVE CHOLANGIOGRAM (Abdomen) ? ?Patient Location: PACU ? ?Anesthesia Type:General ? ?Level of Consciousness: drowsy, patient cooperative and responds to stimulation ? ?Airway & Oxygen Therapy: Patient Spontanous Breathing ? ?Post-op Assessment: Report given to RN and Post -op Vital signs reviewed and stable ? ?Post vital signs: Reviewed and stable ? ?Last Vitals:  ?Vitals Value Taken Time  ?BP 171/95 09/11/21 1129  ?Temp 37.1 ?C 09/11/21 1129  ?Pulse 73 09/11/21 1130  ?Resp 18 09/11/21 1130  ?SpO2 93 % 09/11/21 1130  ?Vitals shown include unvalidated device data. ? ?Last Pain:  ?Vitals:  ? 09/11/21 0941  ?TempSrc:   ?PainSc: 0-No pain  ?   ? ?  ? ?Complications: No notable events documented. ?

## 2021-09-11 NOTE — Anesthesia Postprocedure Evaluation (Signed)
Anesthesia Post Note ? ?Patient: [name] ? ?Procedure(s) Performed: LAPAROSCOPIC CHOLECYSTECTOMY WITH INTRAOPERATIVE CHOLANGIOGRAM (Abdomen) ? ?  ? ?Patient location during evaluation: PACU ?Anesthesia Type: General ?Level of consciousness: awake and alert ?Pain management: pain level controlled ?Vital Signs Assessment: post-procedure vital signs reviewed and stable ?Respiratory status: spontaneous breathing, nonlabored ventilation and respiratory function stable ?Cardiovascular status: blood pressure returned to baseline ?Postop Assessment: no apparent nausea or vomiting ?Anesthetic complications: no ? ? ?No notable events documented. ? ?Last Vitals:  ?Vitals:  ? 09/11/21 1230 09/11/21 1245  ?BP: (!) 142/75 134/74  ?Pulse: 65 65  ?Resp: 12 13  ?Temp:    ?SpO2: 98% 96%  ?  ?Last Pain:  ?Vitals:  ? 09/11/21 1230  ?TempSrc:   ?PainSc: 6   ? ? ?  ?  ?  ?  ?  ?  ? ?Marthenia Rolling ? ? ? ? ?

## 2021-09-11 NOTE — ED Provider Notes (Signed)
?[place] DEPT ?[facility] Emergency Department ?Provider Note ?MRN:  [id number]  ?Arrival date & time: 09/11/21    ? ?Chief Complaint   ?Abdominal Pain ?  ?History of Present Illness   ?[name] is a 66 y.o. year-old female with a history of gallstones presenting to the ED with chief complaint of abdominal pain. ? ?Epigastric abdominal pain for a few weeks, progressively worsening.  Worse with meals.  No lower abdominal pain, no fever, no nausea vomiting, no diarrhea. ? ?Review of Systems  ?A thorough review of systems was obtained and all systems are negative except as noted in the HPI and PMH.  ? ?Patient's Health History   ? ?Past Medical History:  ?Diagnosis Date  ? Allergy   ? Anxiety   ? situational due to COVID  ? Arthritis   ? Bloating   ? Constipation   ? GERD (gastroesophageal reflux disease)   ? Hx of adenomatous polyp of colon 05/11/2019  ? Nausea   ? in the mornings   ? Osteoporosis   ?  ?Past Surgical History:  ?Procedure Laterality Date  ? COLONOSCOPY    ? UPPER GASTROINTESTINAL ENDOSCOPY    ?  ?Family History  ?Problem Relation Age of Onset  ? Arthritis Mother   ? Hypertension Mother   ? Colon polyps Other   ? Ovarian cancer Cousin   ? Colon cancer Neg Hx   ? Esophageal cancer Neg Hx   ? Rectal cancer Neg Hx   ? Stomach cancer Neg Hx   ?  ?Social History  ? ?Socioeconomic History  ? Marital status: Married  ?  Spouse name: Not on file  ? Number of children: 1  ? Years of education: Not on file  ? Highest education level: Not on file  ?Occupational History  ?  Employer: [employer]  ?Tobacco Use  ? Smoking status: Never  ? Smokeless tobacco: Never  ?Vaping Use  ? Vaping Use: Never used  ?Substance and Sexual Activity  ? Alcohol use: No  ? Drug use: No  ? Sexual activity: Not on file  ?Other Topics Concern  ? Not on file  ?Social History Narrative  ? Not on file  ? ?Social Determinants of Health  ? ?Financial Resource Strain: Not on file  ?Food Insecurity: Not on file  ?Transportation  Needs: Not on file  ?Physical Activity: Not on file  ?Stress: Not on file  ?Social Connections: Not on file  ?Intimate Partner Violence: Not on file  ?  ? ?Physical Exam  ? ?Vitals:  ? 09/11/21 0430 09/11/21 0445  ?BP: (!) 162/93 (!) 161/89  ?Pulse: 73 71  ?Resp: 19 20  ?Temp:    ?SpO2: 96% 96%  ?  ?CONSTITUTIONAL: Well-appearing, NAD ?NEURO/PSYCH:  Alert and oriented x 3, no focal deficits ?EYES:  eyes equal and reactive ?ENT/NECK:  no LAD, no JVD ?CARDIO: Regular rate, well-perfused, normal S1 and S2 ?PULM:  CTAB no wheezing or rhonchi ?GI/GU:  non-distended, moderate epigastric tenderness to palpation ?MSK/SPINE:  No gross deformities, no edema ?SKIN:  no rash, atraumatic ? ? ?*Additional and/or pertinent findings included in MDM below ? ?Diagnostic and Interventional Summary  ? ? EKG Interpretation ? ?Date/Time:    ?Ventricular Rate:    ?PR Interval:    ?QRS Duration:   ?QT Interval:    ?QTC Calculation:   ?R Axis:     ?Text Interpretation:   ?  ? ?  ? ?Labs Reviewed  ?COMPREHENSIVE METABOLIC PANEL -  Abnormal; Notable for the following components:  ?    Result Value  ? Glucose, Bld 149 (*)   ? AST 469 (*)   ? ALT 606 (*)   ? Alkaline Phosphatase 155 (*)   ? Total Bilirubin 2.6 (*)   ? All other components within normal limits  ?URINALYSIS, ROUTINE W REFLEX MICROSCOPIC - Abnormal; Notable for the following components:  ? Color, Urine AMBER (*)   ? APPearance CLOUDY (*)   ? Hgb urine dipstick SMALL (*)   ? Ketones, ur 5 (*)   ? Bacteria, UA FEW (*)   ? All other components within normal limits  ?LIPASE, BLOOD  ?CBC  ?PROTIME-INR  ?COMPREHENSIVE METABOLIC PANEL  ?  ?US Abdomen Limited RUQ (LIVER/GB)  ?Final Result  ?  ?CT ABDOMEN PELVIS W CONTRAST  ?Final Result  ?  ?  ?Medications  ?cefTRIAXone (ROCEPHIN) 2 g in sodium chloride 0.9 % 100 mL IVPB (2 g Intravenous New Bag/Given 09/11/21 0532)  ?metroNIDAZOLE (FLAGYL) IVPB 500 mg (has no administration in time range)  ?HYDROmorphone (DILAUDID) injection 0.5 mg (0.5 mg  Intravenous Given 09/11/21 0018)  ?sodium chloride 0.9 % bolus 1,000 mL (0 mLs Intravenous Stopped 09/11/21 0157)  ?iohexol (OMNIPAQUE) 300 MG/ML solution 80 mL (80 mLs Intravenous Contrast Given 09/11/21 0058)  ?  ? ?Procedures  /  Critical Care ?Procedures ? ?ED Course and Medical Decision Making  ?Initial Impression and Ddx ?Suspect choledocholithiasis versus pancreatitis versus GERD versus cholecystitis.  Reassuring vital signs, well-appearing, moderate abdominal tenderness.  No fever to suggest cholangitis.  Labs reveal no significant blood count disturbance or electrolyte disturbance, there is elevation in the LFTs, making choledocholithiasis the leading suspicion.  Will obtain CT imaging to further evaluate. ? ?Past medical/surgical history that increases complexity of ED encounter: History of gallstones ? ?Interpretation of Diagnostics ?I personally reviewed the laboratory assessment and my interpretation is as follows: Moderate LFT elevation ?   ?CT and ultrasound imaging review pericholecystic fluid. ? ?Patient Reassessment and Ultimate Disposition/Management ?Case discussed with Dr. Zenia Resides of general surgery, given the LFT elevation patient most appropriate for medicine admission and ESR P/MRCP and GI consultation in the morning.  Admitted to medicine. ? ?Patient management required discussion with the following services or consulting groups:  Hospitalist Service and General/Trauma Surgery ? ?Complexity of Problems Addressed ?Acute illness or injury that poses threat of life of bodily function ? ?Additional Data Reviewed and Analyzed ?Further history obtained from: ?Further history from spouse/family member ? ?Additional Factors Impacting ED Encounter Risk ?Consideration of hospitalization ? ?Barth Kirks. Sedonia Small, MD ?[facility] ?[email or web address] [email or web address] ? ?Final Clinical Impressions(s) / ED Diagnoses  ? ?  ICD-10-CM   ?1. Choledocholithiasis  K80.50   ?  ?  ?ED  Discharge Orders   ? ? None  ? ?  ?  ? ?Discharge Instructions Discussed with and Provided to Patient:  ? ?Discharge Instructions   ?None ?  ? ?  ?Maudie Flakes, MD ?09/11/21 [phone] ? ?

## 2021-09-11 NOTE — H&P (Signed)
?History and Physical  ? ? ?[name]:[id number] DOB: [birth year] DOA: 09/10/2021 ? ?PCP: Gatha Mayer, MD ? ?Patient coming from: Home ? ?Chief Complaint: Abdominal pain ? ?HPI: [name] is a 66 y.o. female with medical history significant of hyperlipidemia, GERD presented to the ED with a chief complaint of epigastric abdominal pain for several weeks.  Hypertensive, remainder of vital signs stable.  Labs showing no leukocytosis or anemia.  Liver enzymes elevated (AST 469, ALT 606, alk phos 155, T. bili 2.6).  Lipase normal.   ? ?CT abdomen pelvis showing cholelithiasis, distended gallbladder with a small amount of pericholecystic fluid.  No biliary dilatation or CBD stone seen.  Hepatic steatosis. ? ?Right upper quadrant ultrasound showing gallstone and sludge throughout the gallbladder.  Mild gallbladder wall thickening and small amount of pericholecystic fluid.  Hepatic steatosis. ? ?Patient was given Dilaudid, ceftriaxone, Flagyl, and 1 L normal saline bolus.   ? ?General surgery saw the patient and felt that her presentation was consistent with a passed stone via the CBD and that she did not have any signs of acute pancreatitis, cholecystitis, or cholangitis.  Recommended trending LFTs, if continue to trend up on morning labs, then MRCP to rule out choledocholithiasis.  If LFTs are downtrending, then planning on doing laparoscopic cholecystectomy, likely with intraoperative cholangiogram.  No indication for antibiotics from a surgical standpoint. ? ?Patient states about 20 years ago she was told she had gallstones but was not symptomatic at that time.  For the past several months she has experienced intermittent episodes of postprandial epigastric abdominal pain.  This past week, she is having more frequent episodes and the pain is more severe and radiating to her back.  Pain can now happen even when she is not eating.  Her abdomen feels bloated.  No nausea, vomiting, or fevers.  No other complaints.   Denies shortness of breath or chest pain.  ? ?Review of Systems:  ?Review of Systems  ?All other systems reviewed and are negative. ? ?Past Medical History:  ?Diagnosis Date  ? Allergy   ? Anxiety   ? situational due to COVID  ? Arthritis   ? Bloating   ? Constipation   ? GERD (gastroesophageal reflux disease)   ? Hx of adenomatous polyp of colon 05/11/2019  ? Nausea   ? in the mornings   ? Osteoporosis   ? ? ?Past Surgical History:  ?Procedure Laterality Date  ? COLONOSCOPY    ? UPPER GASTROINTESTINAL ENDOSCOPY    ? ? ? reports that she has never smoked. She has never used smokeless tobacco. She reports that she does not drink alcohol and does not use drugs. ? ?No Known Allergies ? ?Family History  ?Problem Relation Age of Onset  ? Arthritis Mother   ? Hypertension Mother   ? Colon polyps Other   ? Ovarian cancer Cousin   ? Colon cancer Neg Hx   ? Esophageal cancer Neg Hx   ? Rectal cancer Neg Hx   ? Stomach cancer Neg Hx   ? ? ?Prior to Admission medications   ?Medication Sig Start Date End Date Taking? Authorizing Provider  ?Cholecalciferol (VITAMIN D) 125 MCG (5000 UT) CAPS Take 5,000 Units by mouth daily.   Yes [provider]  ?fish oil-omega-3 fatty acids 1000 MG capsule Take 1 g by mouth daily.   Yes [provider]  ?Multiple Vitamins-Minerals (MULTIPLE VITAMINS/WOMENS PO) Take 1 tablet by mouth daily.   Yes [provider]  ?Probiotic Product (PROBIOTIC PO) Take 1 Dose by mouth daily. Probiotic liquid   Yes [provider]  ?rosuvastatin (CRESTOR) 5 MG tablet Take 5 mg by mouth at bedtime. 08/24/21  Yes [provider]  ? ? ?Physical Exam: ?Vitals:  ? 09/11/21 0400 09/11/21 0415 09/11/21 0430 09/11/21 0445  ?BP: (!) 159/91 (!) 168/90 (!) 162/93 (!) 161/89  ?Pulse: 75 69 73 71  ?Resp: '11 14 19 20  ' ?Temp:      ?TempSrc:      ?SpO2: 98% 99% 96% 96%  ?Weight:      ?Height:      ? ? ?Physical Exam ?Vitals reviewed.  ?Constitutional:   ?   General: She is not in acute  distress. ?HENT:  ?   Head: Normocephalic and atraumatic.  ?Eyes:  ?   Extraocular Movements: Extraocular movements intact.  ?   Conjunctiva/sclera: Conjunctivae normal.  ?Cardiovascular:  ?   Rate and Rhythm: Normal rate and regular rhythm.  ?   Pulses: Normal pulses.  ?Pulmonary:  ?   Effort: Pulmonary effort is normal. No respiratory distress.  ?   Breath sounds: Normal breath sounds. No wheezing or rales.  ?Abdominal:  ?   General: Bowel sounds are normal. There is distension.  ?   Palpations: Abdomen is soft.  ?   Tenderness: There is abdominal tenderness. There is no guarding or rebound.  ?   Comments: Epigastrium tender to palpation  ?Musculoskeletal:     ?   General: No swelling or tenderness.  ?   Cervical back: Normal range of motion and neck supple.  ?Skin: ?   General: Skin is warm and dry.  ?Neurological:  ?   General: No focal deficit present.  ?   Mental Status: She is alert and oriented to person, place, and time.  ?  ? ?Labs on Admission: I have personally reviewed following labs and imaging studies ? ?CBC: ?Recent Labs  ?Lab 09/10/21 ?1959  ?WBC 10.1  ?HGB 14.1  ?HCT 41.5  ?MCV 91.4  ?PLT 302  ? ?Basic Metabolic Panel: ?Recent Labs  ?Lab 09/10/21 ?1959  ?NA 141  ?K 4.0  ?CL 104  ?CO2 28  ?GLUCOSE 149*  ?BUN 15  ?CREATININE 0.95  ?CALCIUM 9.7  ? ?GFR: ?Estimated Creatinine Clearance: 47.8 mL/min (by C-G formula based on SCr of 0.95 mg/dL). ?Liver Function Tests: ?Recent Labs  ?Lab 09/10/21 ?1959  ?AST 469*  ?ALT 606*  ?ALKPHOS 155*  ?BILITOT 2.6*  ?PROT 7.2  ?ALBUMIN 3.9  ? ?Recent Labs  ?Lab 09/10/21 ?1959  ?LIPASE 36  ? ?No results for input(s): AMMONIA in the last 168 hours. ?Coagulation Profile: ?Recent Labs  ?Lab 09/11/21 ?0019  ?INR 1.0  ? ?Cardiac Enzymes: ?No results for input(s): CKTOTAL, CKMB, CKMBINDEX, TROPONINI in the last 168 hours. ?BNP (last 3 results) ?No results for input(s): PROBNP in the last 8760 hours. ?HbA1C: ?No results for input(s): HGBA1C in the last 72 hours. ?CBG: ?No  results for input(s): GLUCAP in the last 168 hours. ?Lipid Profile: ?No results for input(s): CHOL, HDL, LDLCALC, TRIG, CHOLHDL, LDLDIRECT in the last 72 hours. ?Thyroid Function Tests: ?No results for input(s): TSH, T4TOTAL, FREET4, T3FREE, THYROIDAB in the last 72 hours. ?Anemia Panel: ?No results for input(s): VITAMINB12, FOLATE, FERRITIN, TIBC, IRON, RETICCTPCT in the last 72 hours. ?Urine analysis: ?   ?Component Value Date/Time  ? COLORURINE AMBER (A) 09/10/2021 2111  ? APPEARANCEUR CLOUDY (A) 09/10/2021 2111  ? LABSPEC 1.018 09/10/2021 2111  ?  PHURINE 6.0 09/10/2021 2111  ? [place] NEGATIVE 09/10/2021 2111  ? HGBUR SMALL (A) 09/10/2021 2111  ? [place] NEGATIVE 09/10/2021 2111  ? KETONESUR 5 (A) 09/10/2021 2111  ? [place] NEGATIVE 09/10/2021 2111  ? NITRITE NEGATIVE 09/10/2021 2111  ? LEUKOCYTESUR NEGATIVE 09/10/2021 2111  ? ? ?Radiological Exams on Admission: I have personally reviewed images ?CT ABDOMEN PELVIS W CONTRAST ? ?Result Date: 09/11/2021 ?CLINICAL DATA:  Epigastric pain ?cbd stone.  Nausea, vomiting. EXAM: CT ABDOMEN AND PELVIS WITH CONTRAST TECHNIQUE: Multidetector CT imaging of the abdomen and pelvis was performed using the standard protocol following bolus administration of intravenous contrast. RADIATION DOSE REDUCTION: This exam was performed according to the departmental dose-optimization program which includes automated exposure control, adjustment of the mA and/or kV according to patient size and/or use of iterative reconstruction technique. CONTRAST:  62m OMNIPAQUE IOHEXOL 300 MG/ML  SOLN COMPARISON:  None. FINDINGS: Lower chest: No acute abnormality. Hepatobiliary: Mild diffuse low-density throughout the liver compatible with fatty infiltration. 2.3 cm gallstone within the gallbladder which is moderately distended. No visible intrahepatic or extrahepatic biliary ductal dilatation. No visible CBD stones. Mild pericholecystic fluid. Pancreas: No focal abnormality or ductal  dilatation. Spleen: No focal abnormality.  Normal size. Adrenals/Urinary Tract: No adrenal abnormality. No focal renal abnormality. No stones or hydronephrosis. Urinary bladder is unremarkable. Stomach/Bowel: NConstance Holster

## 2021-09-11 NOTE — Anesthesia Preprocedure Evaluation (Addendum)
Anesthesia Evaluation  ?Patient identified by MRN, date of birth, ID band ?Patient awake ? ? ? ?Reviewed: ?Allergy & Precautions, NPO status , Patient's Chart, lab work & pertinent test results ? ?History of Anesthesia Complications ?Negative for: history of anesthetic complications ? ?Airway ?Mallampati: II ? ?TM Distance: >3 FB ?Neck ROM: Full ? ? ? Dental ?no notable dental hx. ? ?  ?Pulmonary ?neg pulmonary ROS,  ?  ?Pulmonary exam normal ? ? ? ? ? ? ? Cardiovascular ?negative cardio ROS ?Normal cardiovascular exam ? ? ?  ?Neuro/Psych ?Anxiety negative neurological ROS ?   ? GI/Hepatic ?GERD  Controlled,Transaminitis ?Cholecystitis ?  ?Endo/Other  ?negative endocrine ROS ? Renal/GU ?negative Renal ROS  ?negative genitourinary ?  ?Musculoskeletal ? ?(+) Arthritis ,  ? Abdominal ?  ?Peds ? Hematology ?negative hematology ROS ?(+)   ?Anesthesia Other Findings ?Day of surgery medications reviewed with patient. ? Reproductive/Obstetrics ?negative OB ROS ? ?  ? ? ? ? ? ? ? ? ? ? ? ? ? ?  ?  ? ? ? ? ? ? ? ?Anesthesia Physical ?Anesthesia Plan ? ?ASA: 3 and emergent ? ?Anesthesia Plan: General  ? ?Post-op Pain Management: Toradol IV (intra-op)*  ? ?Induction: Intravenous ? ?PONV Risk Score and Plan: 3 and Treatment may vary due to age or medical condition, Ondansetron, Dexamethasone and Midazolam ? ?Airway Management Planned: Oral ETT ? ?Additional Equipment: None ? ?Intra-op Plan:  ? ?Post-operative Plan: Extubation in OR ? ?Informed Consent: I have reviewed the patients History and Physical, chart, labs and discussed the procedure including the risks, benefits and alternatives for the proposed anesthesia with the patient or authorized representative who has indicated his/her understanding and acceptance.  ? ? ? ?Dental advisory given ? ?Plan Discussed with: CRNA ? ?Anesthesia Plan Comments:   ? ? ? ? ? ?Anesthesia Quick Evaluation ? ?

## 2021-09-11 NOTE — Op Note (Signed)
?  09/10/2021 - 09/11/2021 ? ?11:03 AM ? ?PATIENT:  [name]  66 y.o. female ? ?PRE-OPERATIVE DIAGNOSIS:  Cholecystitis ? ?POST-OPERATIVE DIAGNOSIS:  Cholecystitis ? ?PROCEDURE:  Procedure(s): ?LAPAROSCOPIC CHOLECYSTECTOMY WITH INTRAOPERATIVE CHOLANGIOGRAM ? ?SURGEON:  Surgeon(s): ?Georganna Skeans, MD ? ?ASSISTANTS: Melina Modena, PA-C  ? ?ANESTHESIA:   local and general ? ?EBL:  Total I/O ?In: 700 [I.V.:600; IV Piggyback:100] ?Out: -  ? ?BLOOD ADMINISTERED:none ? ?DRAINS: none  ? ?SPECIMEN:  Excision ? ?DISPOSITION OF SPECIMEN:  PATHOLOGY ? ?COUNTS:  YES ? ?DICTATION: .Dragon Dictation ?Findings: Acute cholecystitis, no common bile duct filling defects ? ?Procedure in detail: Informed consent was obtained.  She received intravenous antibiotics.  She was brought to the operating room and general endotracheal anesthesia was administered by the anesthesia staff.  Her abdomen was prepped and draped in sterile fashion.The infraumbilical region was infiltrated with local. Infraumbilical incision was made. Subcutaneous tissues were dissected down revealing the anterior fascia. This was divided sharply along the midline. Peritoneal cavity was entered under direct vision without complication. A 0 Vicryl pursestring was placed around the fascial opening. Hassan trocar was inserted into the abdomen. The abdomen was insufflated with carbon dioxide in standard fashion. Under direct vision a 5 mm epigastric and two 5 mm right abdominal ports were placed.  Local was used at each port site.  Laparoscopic exploration revealed a distended and inflamed gallbladder.  This was drained with the drainage suction device.  This revealed brown bile.  The dome of the gallbladder was then retracted superior medially.  The infundibulum was retracted inferior laterally.  Dissection began laterally and progressed medially identifying the cystic duct.  Dissection continued to we had a critical view of safety around the cystic duct.  A clip was placed  proximally on the cystic duct and a small nick was made in it.  A cholangiogram catheter was inserted.  Intraoperative cholangiogram was obtained demonstrating no common bile duct filling defects and good flow of contrast into the duodenum.  The cholangiogram catheter was removed.  3 clips were placed proximally on the cystic duct and it was divided.  Further dissection revealed approximately branch of the cystic artery.  This was clipped twice proximally and divided distally with cautery.  The gallbladder was taken off the liver bed using Bovie cautery.  We did encounter a posterior branch of the cystic artery.  This was clipped twice proximally with good hemostasis.  The gallbladder was taken the rest of the way off the liver bed.  It was placed in a bag and removed from the abdomen.  It was sent to pathology.  The liver bed was irrigated and hemostasis was obtained with cautery.  Clips all remain in good position.  The irrigation fluid was evacuated.  The liver bed was dry.  Ports were removed under direct vision.  Pneumoperitoneum was released.  All 4 wounds were irrigated.  The infraumbilical fascia was closed by tying the pursestring.  Each wound was closed with running 4-0 Vicryl followed by Dermabond.  All counts were correct.  She tolerated the procedure well without apparent complication and was taken recovery in stable condition. ? ?PATIENT DISPOSITION:  PACU - hemodynamically stable. ?  ?Delay start of Pharmacological VTE agent (>24hrs) due to surgical blood loss or risk of bleeding:  no ? ?Georganna Skeans, MD, MPH, FACS ?Pager: [phone]  ?3/31/202311:03 AM ? ? ? ? ? ? ? ? ? ? ? ? ?  ?

## 2021-09-11 NOTE — Assessment & Plan Note (Signed)
-  Resume statin when no longer n.p.o. ?

## 2021-09-11 NOTE — Discharge Instructions (Signed)
[facility] ? ?Please arrive at least 30 min before your appointment to complete your check in paperwork.  If you are unable to arrive 30 min prior to your appointment time we may have to cancel or reschedule you. ?LAPAROSCOPIC SURGERY: POST OP INSTRUCTIONS ?Always review your discharge instruction sheet given to you by the facility where your surgery was performed. ?IF YOU HAVE DISABILITY OR FAMILY LEAVE FORMS, YOU MUST BRING THEM TO THE OFFICE FOR PROCESSING.   ?DO NOT GIVE THEM TO YOUR DOCTOR. ? ?PAIN CONTROL ? ?First take acetaminophen (Tylenol) AND/or ibuprofen (Advil) to control your pain after surgery.  Follow directions on package.  Taking acetaminophen (Tylenol) and/or ibuprofen (Advil) regularly after surgery will help to control your pain and lower the amount of prescription pain medication you may need.  You should not take more than 4,000 mg (4 grams) of acetaminophen (Tylenol) in 24 hours.  You should not take ibuprofen (Advil), aleve, motrin, naprosyn or other NSAIDS if you have a history of stomach ulcers or chronic kidney disease.  ?A prescription for pain medication may be given to you upon discharge.  Take your pain medication as prescribed, if you still have uncontrolled pain after taking acetaminophen (Tylenol) or ibuprofen (Advil). ?Use ice packs to help control pain. ?If you need a refill on your pain medication, please contact your pharmacy.  They will contact our office to request authorization. Prescriptions will not be filled after 5pm or on week-ends. ? ?HOME MEDICATIONS ?Take your usually prescribed medications unless otherwise directed. ? ?DIET ?You should follow a light diet the first few days after arrival home.  Be sure to include lots of fluids daily. Avoid fatty, fried foods.  ? ?CONSTIPATION ?It is common to experience some constipation after surgery and if you are taking pain medication.  Increasing fluid intake and taking a stool softener (such as Colace)  will usually help or prevent this problem from occurring.  A mild laxative (Milk of Magnesia or Miralax) should be taken according to package instructions if there are no bowel movements after 48 hours. ? ?WOUND/INCISION CARE ?Most patients will experience some swelling and bruising in the area of the incisions.  Ice packs will help.  Swelling and bruising can take several days to resolve.  ?Unless discharge instructions indicate otherwise, follow guidelines below  ?STERI-STRIPS - you may remove your outer bandages 48 hours after surgery, and you may shower at that time.  You have steri-strips (small skin tapes) in place directly over the incision.  These strips should be left on the skin for 7-10 days.   ?DERMABOND/SKIN GLUE - you may shower in 24 hours.  The glue will flake off over the next 2-3 weeks. ?Any sutures or staples will be removed at the office during your follow-up visit. ? ?ACTIVITIES ?You may resume regular (light) daily activities beginning the next day--such as daily self-care, walking, climbing stairs--gradually increasing activities as tolerated.  You may have sexual intercourse when it is comfortable.  Refrain from any heavy lifting or straining until approved by your doctor. ?You may drive when you are no longer taking prescription pain medication, you can comfortably wear a seatbelt, and you can safely maneuver your car and apply brakes. ? ?FOLLOW-UP ?You should see your doctor in the office for a follow-up appointment approximately 2-3 weeks after your surgery.  You should have been given your post-op/follow-up appointment when your surgery was scheduled.  If you did not receive a post-op/follow-up appointment, make sure   that you call for this appointment within a day or two after you arrive home to insure a convenient appointment time. ? ? ?WHEN TO CALL YOUR DOCTOR: ?Fever over 101.0 ?Inability to urinate ?Continued bleeding from incision. ?Increased pain, redness, or drainage from the  incision. ?Increasing abdominal pain ? ?The clinic staff is available to answer your questions during regular business hours.  Please don?t hesitate to call and ask to speak to one of the nurses for clinical concerns.  If you have a medical emergency, go to the nearest emergency room or call 911.  A surgeon from [facility] is always on call at the hospital. ?[address], [address], [place], [place]  [address] ? [address] [address], [place], [place]   [address] ?[phone] ? [phone] ? FAX [phone] ? ? ? ? ?Managing Your Pain After Surgery Without Opioids ? ? ? ?Thank you for participating in our program to help patients manage their pain after surgery without opioids. This is part of our effort to provide you with the best care possible, without exposing you or your family to the risk that opioids pose. ? ?What pain can I expect after surgery? ?You can expect to have some pain after surgery. This is normal. The pain is typically worse the day after surgery, and quickly begins to get better. ?Many studies have found that many patients are able to manage their pain after surgery with Over-the-Counter (OTC) medications such as Tylenol and Motrin. If you have a condition that does not allow you to take Tylenol or Motrin, notify your surgical team. ? ?How will I manage my pain? ?The best strategy for controlling your pain after surgery is around the clock pain control with Tylenol (acetaminophen) and Motrin (ibuprofen or Advil). Alternating these medications with each other allows you to maximize your pain control. In addition to Tylenol and Motrin, you can use heating pads or ice packs on your incisions to help reduce your pain. ? ?How will I alternate your regular strength over-the-counter pain medication? ?You will take a dose of pain medication every three hours. ?Start by taking 650 mg of Tylenol (2 pills of 325 mg) ?3 hours later take 600 mg of Motrin (3 pills of 200 mg) ?3 hours after  taking the Motrin take 650 mg of Tylenol ?3 hours after that take 600 mg of Motrin. ? ? ?- 1 - ? ?See example - if your first dose of Tylenol is at 12:00 PM ? ? ?12:00 PM Tylenol 650 mg (2 pills of 325 mg)  ?3:00 PM Motrin 600 mg (3 pills of 200 mg)  ?6:00 PM Tylenol 650 mg (2 pills of 325 mg)  ?9:00 PM Motrin 600 mg (3 pills of 200 mg)  ?Continue alternating every 3 hours  ? ?We recommend that you follow this schedule around-the-clock for at least 3 days after surgery, or until you feel that it is no longer needed. Use the table on the last page of this handout to keep track of the medications you are taking. ?Important: ?Do not take more than 3000mg of Tylenol or 3200mg of Motrin in a 24-hour period. ?Do not take ibuprofen/Motrin if you have a history of bleeding stomach ulcers, severe kidney disease, &/or actively taking a blood thinner ? ?What if I still have pain? ?If you have pain that is not controlled with the over-the-counter pain medications (Tylenol and Motrin or Advil) you might have what we call ?breakthrough? pain. You will receive a prescription   for a small amount of an opioid pain medication such as Oxycodone, Tramadol, or Tylenol with Codeine. Use these opioid pills in the first 24 hours after surgery if you have breakthrough pain. Do not take more than 1 pill every 4-6 hours. ? ?If you still have uncontrolled pain after using all opioid pills, don't hesitate to call our staff using the number provided. We will help make sure you are managing your pain in the best way possible, and if necessary, we can provide a prescription for additional pain medication. ? ? ?Day 1   ? ?Time  ?Name of Medication Number of pills taken  ?Amount of Acetaminophen  ?Pain Level  ? ?Comments  ?AM PM       ?AM PM       ?AM PM       ?AM PM       ?AM PM       ?AM PM       ?AM PM       ?AM PM       ?Total Daily amount of Acetaminophen ?Do not take more than  3,000 mg per day    ? ? ?Day 2   ? ?Time  ?Name of Medication  Number of pills ?taken  ?Amount of Acetaminophen  ?Pain Level  ? ?Comments  ?AM PM       ?AM PM       ?AM PM       ?AM PM       ?AM PM       ?AM PM       ?AM PM       ?AM PM       ?Total Daily amount of Acetaminop

## 2021-09-11 NOTE — Progress Notes (Signed)
Mobility Specialist Progress Note: ? ? 09/11/21 1619  ?Mobility  ?Activity Ambulated with assistance in hallway  ?Level of Assistance Contact guard assist, steadying assist  ?Assistive Device None  ?Distance Ambulated (ft) 250 ft  ?Activity Response Tolerated well  ?$Mobility charge 1 Mobility  ? ?Pt received in bed willing to participate in mobility. Complaints of 3/10 abdominal pain. Pt educated on log roll to help with pain when getting in and out of bed. Left in bed with call bell in reach and all needs met.  ? ?[name] ?Mobility Specialist ?Primary Phone [phone] ? ?

## 2021-09-11 NOTE — Consult Note (Signed)
? ?[name] ?1955/11/26  ?[id number].   ? ?Requesting MD: Dr. Sedonia Small ?Chief Complaint/Reason for Consult: cholelithiasis ? ?HPI:  ?[name] is a 66 year old female who presents to the ED with abdominal pain.  She says that 2 days ago at work she began having severe epigastric pain associated with vomiting, however after she vomited the pain went away and she felt better.  The pain recurred again yesterday, prompting her to come to the ED.  She says she has previously had intermittent epigastric pain over the last few months, but it has never been this severe.  The pain radiates to the right upper quadrant and back.  She says she was told many years ago that she had gallstones but was not having any symptoms at the time.  Labs in the ED are significant for elevated transaminases and a T. bili of 2.6.  WBC and lipase are normal.  CT scan and ultrasound both showed cholelithiasis.  General surgery was consulted. ? ?The patient has never had previous abdominal surgeries.  She does not take any blood thinners. ? ?ROS: ?Review of Systems  ?Constitutional:  Negative for chills and fever.  ?Respiratory:  Negative for shortness of breath, wheezing and stridor.   ?Cardiovascular:  Negative for chest pain and palpitations.  ?Gastrointestinal:  Positive for abdominal pain, nausea and vomiting.  ?Skin:  Negative for itching and rash.  ?Neurological:  Negative for loss of consciousness and weakness.  ? ?Family History  ?Problem Relation Age of Onset  ? Arthritis Mother   ? Hypertension Mother   ? Colon polyps Other   ? Ovarian cancer Cousin   ? Colon cancer Neg Hx   ? Esophageal cancer Neg Hx   ? Rectal cancer Neg Hx   ? Stomach cancer Neg Hx   ? ? ?Past Medical History:  ?Diagnosis Date  ? Allergy   ? Anxiety   ? situational due to COVID  ? Arthritis   ? Bloating   ? Constipation   ? GERD (gastroesophageal reflux disease)   ? Hx of adenomatous polyp of colon 05/11/2019  ? Nausea   ? in the mornings   ? Osteoporosis   ? ? ?Past  Surgical History:  ?Procedure Laterality Date  ? COLONOSCOPY    ? UPPER GASTROINTESTINAL ENDOSCOPY    ? ? ?Social History:  reports that she has never smoked. She has never used smokeless tobacco. She reports that she does not drink alcohol and does not use drugs. ? ?Allergies: No Known Allergies ? ?(Not in a hospital admission) ? ? ? ?Physical Exam: ?Blood pressure (!) 153/86, pulse 74, temperature 98.1 ?F (36.7 ?C), temperature source Oral, resp. rate 16, height 5' (1.524 m), weight 63.5 kg, SpO2 100 %. ?General: resting comfortably, appears stated age, no apparent distress ?Neurological: alert and oriented, no focal deficits, cranial nerves grossly in tact ?HEENT: normocephalic, atraumatic, oropharynx clear, no scleral icterus ?CV: regular rate and rhythm, no murmurs, extremities warm and well-perfused ?Respiratory: normal work of breathing on room air, symmetric chest wall expansion ?Abdomen: soft, nondistended, mild epigastric and RUQ tenderness to palpation.  No surgical scars. No masses or organomegaly. ?Extremities: warm and well-perfused, no deformities, moving all extremities spontaneously ?Psychiatric: normal mood and affect ?Skin: warm and dry, no jaundice, no rashes or lesions ? ? ?Results for orders placed or performed during the hospital encounter of 09/10/21 (from the past 48 hour(s))  ?Lipase, blood     Status: None  ? Collection Time: 09/10/21  7:59  PM  ?Result Value Ref Range  ? Lipase 36 11 - 51 U/L  ?  Comment: Performed at [facility], [place] [address] [place], [place] [address]  ?Comprehensive metabolic panel     Status: Abnormal  ? Collection Time: 09/10/21  7:59 PM  ?Result Value Ref Range  ? Sodium 141 135 - 145 mmol/L  ? Potassium 4.0 3.5 - 5.1 mmol/L  ? Chloride 104 98 - 111 mmol/L  ? CO2 28 22 - 32 mmol/L  ? Glucose, Bld 149 (H) 70 - 99 mg/dL  ?  Comment: Glucose reference range applies only to samples taken after fasting for at least 8 hours.  ? BUN 15 8 - 23 mg/dL  ? Creatinine,  Ser 0.95 0.44 - 1.00 mg/dL  ? Calcium 9.7 8.9 - 10.3 mg/dL  ? Total Protein 7.2 6.5 - 8.1 g/dL  ? Albumin 3.9 3.5 - 5.0 g/dL  ? AST 469 (H) 15 - 41 U/L  ? ALT 606 (H) 0 - 44 U/L  ? Alkaline Phosphatase 155 (H) 38 - 126 U/L  ? Total Bilirubin 2.6 (H) 0.3 - 1.2 mg/dL  ? GFR, Estimated >60 >60 mL/min  ?  Comment: (NOTE) ?Calculated using the CKD-EPI Creatinine Equation (2021) ?  ? Anion gap 9 5 - 15  ?  Comment: Performed at [facility], [place] [address] [place], [place] [address]  ?CBC     Status: None  ? Collection Time: 09/10/21  7:59 PM  ?Result Value Ref Range  ? WBC 10.1 4.0 - 10.5 K/uL  ? RBC 4.54 3.87 - 5.11 MIL/uL  ? Hemoglobin 14.1 12.0 - 15.0 g/dL  ? HCT 41.5 36.0 - 46.0 %  ? MCV 91.4 80.0 - 100.0 fL  ? MCH 31.1 26.0 - 34.0 pg  ? MCHC 34.0 30.0 - 36.0 g/dL  ? RDW 12.9 11.5 - 15.5 %  ? Platelets 302 150 - 400 K/uL  ? nRBC 0.0 0.0 - 0.2 %  ?  Comment: Performed at [facility], [place] [address] [place], [place] [address]  ?Urinalysis, Routine w reflex microscopic Urine, Clean Catch     Status: Abnormal  ? Collection Time: 09/10/21  9:11 PM  ?Result Value Ref Range  ? Color, Urine AMBER (A) YELLOW  ?  Comment: BIOCHEMICALS MAY BE AFFECTED BY COLOR  ? APPearance CLOUDY (A) CLEAR  ? Specific Gravity, Urine 1.018 1.005 - 1.030  ? pH 6.0 5.0 - 8.0  ? Glucose, UA NEGATIVE NEGATIVE mg/dL  ? Hgb urine dipstick SMALL (A) NEGATIVE  ? Bilirubin Urine NEGATIVE NEGATIVE  ? Ketones, ur 5 (A) NEGATIVE mg/dL  ? Protein, ur NEGATIVE NEGATIVE mg/dL  ? Nitrite NEGATIVE NEGATIVE  ? Leukocytes,Ua NEGATIVE NEGATIVE  ? RBC / HPF 6-10 0 - 5 RBC/hpf  ? WBC, UA 6-10 0 - 5 WBC/hpf  ? Bacteria, UA FEW (A) NONE SEEN  ? Squamous Epithelial / LPF 0-5 0 - 5  ? Mucus PRESENT   ? Ca Oxalate Crys, UA PRESENT   ?  Comment: Performed at [facility], [place] [address], [place], [place] [address]  ?Protime-INR     Status: None  ? Collection Time: 09/11/21 12:19 AM  ?Result Value Ref Range  ? Prothrombin Time 13.0 11.4 - 15.2 seconds   ? INR 1.0 0.8 - 1.2  ?  Comment: (NOTE) ?INR goal varies based on device and disease states. ?Performed at [facility], [place] [address],  [place], [place] ?[address] ?  ? ?CT ABDOMEN PELVIS W CONTRAST ? ?Result Date: 09/11/2021 ?CLINICAL DATA:  Epigastric pain ?cbd stone.  Nausea, vomiting. EXAM: CT ABDOMEN AND PELVIS WITH CONTRAST TECHNIQUE: Multidetector CT imaging of the abdomen and pelvis was performed using the standard protocol following bolus administration of intravenous contrast. RADIATION DOSE REDUCTION: This exam was performed according to the departmental dose-optimization program which includes automated exposure control, adjustment of the mA and/or kV according to patient size and/or use of iterative reconstruction technique. CONTRAST:  53m OMNIPAQUE IOHEXOL 300 MG/ML  SOLN COMPARISON:  None. FINDINGS: Lower chest: No acute abnormality. Hepatobiliary: Mild diffuse low-density throughout the liver compatible with fatty infiltration. 2.3 cm gallstone within the gallbladder which is moderately distended. No visible intrahepatic or extrahepatic biliary ductal dilatation. No visible CBD stones. Mild pericholecystic fluid. Pancreas: No focal abnormality or ductal dilatation. Spleen: No focal abnormality.  Normal size. Adrenals/Urinary Tract: No adrenal abnormality. No focal renal abnormality. No stones or hydronephrosis. Urinary bladder is unremarkable. Stomach/Bowel: Normal appendix. Stomach, large and small bowel grossly unremarkable. Vascular/Lymphatic: Scattered aortic calcifications. No evidence of aneurysm or adenopathy. Reproductive: Uterus and adnexa unremarkable.  No mass. Other: No free fluid or free air. Musculoskeletal: No acute bony abnormality. IMPRESSION: Cholelithiasis. Gallbladder is distended with small amount of pericholecystic fluid. Cannot exclude acute cholecystitis. Consider further evaluation with right upper quadrant ultrasound. No biliary dilatation or CBD stone visualized.  Hepatic steatosis. Electronically Signed   By: KRolm BaptiseM.D.   On: 09/11/2021 01:08  ? ?UKoreaAbdomen Limited RUQ (LIVER/GB) ? ?Result Date: 09/11/2021 ?CLINICAL DATA:  Cholelithiasis EXAM: ULTRASOUND ABDOM

## 2021-09-11 NOTE — Assessment & Plan Note (Signed)
Elevated liver enzymes ?Liver enzymes elevated (AST 469, ALT 606, alk phos 155, T. bili 2.6).  CT abdomen pelvis showing cholelithiasis, distended gallbladder with a small amount of pericholecystic fluid.  No biliary dilatation or CBD stone seen.  Right upper quadrant ultrasound showing gallstone and sludge throughout the gallbladder.  Mild gallbladder wall thickening and small amount of pericholecystic fluid.  Pancreatitis less likely given normal lipase.  Acute cholangitis less likely given no fever or leukocytosis.  General surgery does not feel that her presentation is consistent with acute cholecystitis either, however, suspecting passed CBD stone.   ?-Repeat LFTs.  If trending up, then MRCP to rule out choledocholithiasis.  If trending down, then laparoscopic cholecystectomy likely with intraoperative cholangiogram per general surgery.   ?-General surgery does not feel that antibiotics need to be continued. ?-Keep n.p.o. ?-Gentle IV fluid hydration ?-Continue Dilaudid as needed for severe pain ?

## 2021-09-11 NOTE — ED Notes (Signed)
Consent signed and at bedside  

## 2021-09-11 NOTE — Telephone Encounter (Signed)
Good Morning [name], ? ? ?Patient called to cancel appointment with you today at 9:15 due to currently being in the hospital. ? ?Patient stated that she would call back at a later time to reschedule.   ?

## 2021-09-11 NOTE — Progress Notes (Addendum)
Discussed with general surgery team, they will take over as primary team.  [place] signed off.  Please let us know if we can be of any assistance. ?

## 2021-09-11 NOTE — ED Notes (Signed)
Patient has returned from CT

## 2021-09-11 NOTE — Anesthesia Procedure Notes (Signed)
Procedure Name: Intubation ?Date/Time: 09/11/2021 10:11 AM ?Performed by: Janace Litten, CRNA ?Pre-anesthesia Checklist: Patient identified, Emergency Drugs available, Suction available and Patient being monitored ?Patient Re-evaluated:Patient Re-evaluated prior to induction ?Oxygen Delivery Method: Circle System Utilized ?Preoxygenation: Pre-oxygenation with 100% oxygen ?Induction Type: IV induction ?Ventilation: Mask ventilation without difficulty ?Laryngoscope Size: Mac and 3 ?Grade View: Grade I ?Tube type: Oral ?Tube size: 6.5 mm ?Number of attempts: 1 ?Airway Equipment and Method: Stylet ?Placement Confirmation: ETT inserted through vocal cords under direct vision, positive ETCO2 and breath sounds checked- equal and bilateral ?Secured at: 20 cm ?Tube secured with: Tape ?Dental Injury: Teeth and Oropharynx as per pre-operative assessment  ? ? ? ? ?

## 2021-09-11 NOTE — Assessment & Plan Note (Signed)
Improved after pain control.  Not on any antihypertensives at home. ?-Continue pain management and monitor blood pressure closely ?

## 2021-09-11 NOTE — Discharge Summary (Signed)
? ? ?Patient ID: ?[name] ?[id number] ?1955-11-17 66 y.o. ? ?Admit date: 09/10/2021 ?Discharge date: 09/12/2021 ? ?Admitting Diagnosis: ?Acute cholecysitits ?Elevated LFTs ? ?Discharge Diagnosis ?Patient Active Problem List  ? Diagnosis Date Noted  ? Epigastric abdominal pain 09/11/2021  ? Transient elevated blood pressure 09/11/2021  ? HLD (hyperlipidemia) 09/11/2021  ? Hx of adenomatous polyp of colon 05/11/2019  ? CONSTIPATION 10/22/2008  ? ABDOMINAL PAIN-LUQ 10/22/2008  ? HELICOBACTER PYLORI GASTRITIS, HX OF 10/22/2008  ?Cholecystitis, s/p lap chole ? ?Consultants ?General surgery ? ?Reason for Admission: ?[name] is a 66 y.o. female with medical history significant of hyperlipidemia, GERD presented to the ED with a chief complaint of epigastric abdominal pain for several weeks.  Hypertensive, remainder of vital signs stable.  Labs showing no leukocytosis or anemia.  Liver enzymes elevated (AST 469, ALT 606, alk phos 155, T. bili 2.6).  Lipase normal.   ?  ?CT abdomen pelvis showing cholelithiasis, distended gallbladder with a small amount of pericholecystic fluid.  No biliary dilatation or CBD stone seen.  Hepatic steatosis. ?  ?Right upper quadrant ultrasound showing gallstone and sludge throughout the gallbladder.  Mild gallbladder wall thickening and small amount of pericholecystic fluid.  Hepatic steatosis. ?  ?Patient was given Dilaudid, ceftriaxone, Flagyl, and 1 L normal saline bolus.   ?  ?General surgery saw the patient and felt that her presentation was consistent with a passed stone via the CBD and that she did not have any signs of acute pancreatitis, cholecystitis, or cholangitis.  Recommended trending LFTs, if continue to trend up on morning labs, then MRCP to rule out choledocholithiasis.  If LFTs are downtrending, then planning on doing laparoscopic cholecystectomy, likely with intraoperative cholangiogram.  No indication for antibiotics from a surgical standpoint. ?  ?Patient states about 20  years ago she was told she had gallstones but was not symptomatic at that time.  For the past several months she has experienced intermittent episodes of postprandial epigastric abdominal pain.  This past week, she is having more frequent episodes and the pain is more severe and radiating to her back.  Pain can now happen even when she is not eating.  Her abdomen feels bloated.  No nausea, vomiting, or fevers.  No other complaints.  Denies shortness of breath or chest pain.  ? ?Procedures ?Lap chole with IOC, Dr. Grandville Silos 09/11/21 ? ?Hospital Course:  ?The patient was admitted and underwent a laparoscopic cholecystectomy with IOC.  The patient tolerated the procedure well.  On POD 1, the patient was tolerating a regular diet, voiding well, mobilizing, and pain was controlled with oral pain medications.  Her repeat LFTs were trending down. The patient was stable for DC home at this time with appropriate follow up made. ? ? ?Physical Exam: ?Abd: soft, appropriately tender, +BS, incisions c/d/i ? ?Allergies as of 09/12/2021   ?No Known Allergies ?  ? ?  ?Medication List  ?  ? ?TAKE these medications   ? ?acetaminophen 500 MG tablet ?Commonly known as: TYLENOL ?Take 2 tablets (1,000 mg total) by mouth every 6 (six) hours as needed. ?  ?fish oil-omega-3 fatty acids 1000 MG capsule ?Take 1 g by mouth daily. ?  ?MULTIPLE VITAMINS/WOMENS PO ?Take 1 tablet by mouth daily. ?  ?oxyCODONE 5 MG immediate release tablet ?Commonly known as: Oxy IR/ROXICODONE ?Take 1 tablet (5 mg total) by mouth every 4 (four) hours as needed for moderate pain. ?  ?PROBIOTIC PO ?Take 1 Dose by mouth daily. Probiotic  liquid ?  ?rosuvastatin 5 MG tablet ?Commonly known as: CRESTOR ?Take 5 mg by mouth at bedtime. ?  ?Vitamin D 125 MCG (5000 UT) Caps ?Take 5,000 Units by mouth daily. ?  ? ?  ? ? ? ? Follow-up Information   ? ? [facility], [facility] Follow up on 10/06/2021.   ?Specialty: General Surgery ?Why: 8:45am,  please arrive 30 minutes prior to  your appointment for paperwork and check in process.  please bring photo ID and insurance card. ?Contact information: ?[address] ?[address] ? [place] [address] ?[phone] ? ? ?  ?  ? ?  ?  ? ?  ? ? ?Signed: ?Kelly Osborne, PA-C ?[facility] ?09/12/2021, 8:09 AM ?Please see Amion for pager number during day hours 7:00am-4:30pm, 7-11:30am on Weekends ? ? ?

## 2021-09-11 NOTE — Progress Notes (Signed)
Patient ID: [name], female   DOB: February 14, 1956, 66 y.o.   MRN: [id number] ?Day of Surgery  ?  ?Subjective: ?Less RUQ pain ?ROS negative except as listed above. ?Objective: ?Vital signs in last 24 hours: ?Temp:  [97.7 ?F (36.5 ?C)-98.9 ?F (37.2 ?C)] 98.6 ?F (37 ?C) (03/31 0743) ?Pulse Rate:  [62-75] 66 (03/31 0743) ?Resp:  [11-20] 14 (03/31 0743) ?BP: (133-193)/(80-95) 140/80 (03/31 0743) ?SpO2:  [95 %-100 %] 100 % (03/31 0743) ?Weight:  [63.5 kg] 63.5 kg (03/30 1946) ?  ? ?Intake/Output from previous day: ?03/30 0701 - 03/31 0700 ?In: 1000 [IV Piggyback:1000] ?Out: -  ?Intake/Output this shift: ?Total I/O ?In: 100 [IV Piggyback:100] ?Out: -  ? ?General appearance: alert and cooperative ?Resp: clear to auscultation bilaterally ?Cardio: regular rate and rhythm ?GI: soft, no sig tenderness ?Extremities: calves soft ? ?Lab Results: ?CBC  ?Recent Labs  ?  09/10/21 ?1959  ?WBC 10.1  ?HGB 14.1  ?HCT 41.5  ?PLT 302  ? ?BMET ?Recent Labs  ?  09/10/21 ?1959 09/11/21 ?0747  ?NA 141 137  ?K 4.0 3.6  ?CL 104 104  ?CO2 28 24  ?GLUCOSE 149* 98  ?BUN 15 10  ?CREATININE 0.95 0.72  ?CALCIUM 9.7 8.5*  ? ?PT/INR ?Recent Labs  ?  09/11/21 ?0019  ?LABPROT 13.0  ?INR 1.0  ? ?ABG ?No results for input(s): PHART, HCO3 in the last 72 hours. ? ?Invalid input(s): PCO2, PO2 ? ?Studies/Results: ?CT ABDOMEN PELVIS W CONTRAST ? ?Result Date: 09/11/2021 ?CLINICAL DATA:  Epigastric pain ?cbd stone.  Nausea, vomiting. EXAM: CT ABDOMEN AND PELVIS WITH CONTRAST TECHNIQUE: Multidetector CT imaging of the abdomen and pelvis was performed using the standard protocol following bolus administration of intravenous contrast. RADIATION DOSE REDUCTION: This exam was performed according to the departmental dose-optimization program which includes automated exposure control, adjustment of the mA and/or kV according to patient size and/or use of iterative reconstruction technique. CONTRAST:  76m OMNIPAQUE IOHEXOL 300 MG/ML  SOLN COMPARISON:  None. FINDINGS: Lower  chest: No acute abnormality. Hepatobiliary: Mild diffuse low-density throughout the liver compatible with fatty infiltration. 2.3 cm gallstone within the gallbladder which is moderately distended. No visible intrahepatic or extrahepatic biliary ductal dilatation. No visible CBD stones. Mild pericholecystic fluid. Pancreas: No focal abnormality or ductal dilatation. Spleen: No focal abnormality.  Normal size. Adrenals/Urinary Tract: No adrenal abnormality. No focal renal abnormality. No stones or hydronephrosis. Urinary bladder is unremarkable. Stomach/Bowel: Normal appendix. Stomach, large and small bowel grossly unremarkable. Vascular/Lymphatic: Scattered aortic calcifications. No evidence of aneurysm or adenopathy. Reproductive: Uterus and adnexa unremarkable.  No mass. Other: No free fluid or free air. Musculoskeletal: No acute bony abnormality. IMPRESSION: Cholelithiasis. Gallbladder is distended with small amount of pericholecystic fluid. Cannot exclude acute cholecystitis. Consider further evaluation with right upper quadrant ultrasound. No biliary dilatation or CBD stone visualized. Hepatic steatosis. Electronically Signed   By: KRolm BaptiseM.D.   On: 09/11/2021 01:08  ? ?UKoreaAbdomen Limited RUQ (LIVER/GB) ? ?Result Date: 09/11/2021 ?CLINICAL DATA:  Cholelithiasis EXAM: ULTRASOUND ABDOMEN LIMITED RIGHT UPPER QUADRANT COMPARISON:  CT earlier today FINDINGS: Gallbladder: 3.2 cm gallstone within the gallbladder. There is also sludge noted within the gallbladder. Gallbladder wall is thickened at 5 mm with trace pericholecystic fluid. Negative sonographic Murphy sign. Common bile duct: Diameter: Normal caliber, 6 mm Liver: Slight increased echotexture throughout the liver suggesting fatty infiltration. No focal hepatic abnormality. Portal vein is patent on color Doppler imaging with normal direction of blood flow towards the liver. Other:  None. IMPRESSION: Gallstone and sludge throughout the gallbladder. Mild  gallbladder wall thickening and small amount of pericholecystic fluid. Recommend clinical correlation to exclude acute cholecystitis. Hepatic steatosis. Electronically Signed   By: Rolm Baptise M.D.   On: 09/11/2021 02:17   ? ?Anti-infectives: ?Anti-infectives (From admission, onward)  ? ? Start     Dose/Rate Route Frequency Ordered Stop  ? 09/11/21 0515  cefTRIAXone (ROCEPHIN) 2 g in sodium chloride 0.9 % 100 mL IVPB       ? 2 g ?200 mL/hr over 30 Minutes Intravenous  Once 09/11/21 0505 09/11/21 0606  ? 09/11/21 0515  metroNIDAZOLE (FLAGYL) IVPB 500 mg  Status:  Discontinued       ? 500 mg ?100 mL/hr over 60 Minutes Intravenous Every 12 hours 09/11/21 0505 09/11/21 0731  ? ?  ? ? ?Assessment/Plan: ?Gallstones and elevated LFTs - LFTs downtrending. Will proceed with laparoscopic cholecystectomy and cholangiogram. Procedure, risks, and benefits discussed and she agrees. Rocephin and Flagyl IV. ? ? LOS: 0 days  ? ? ?Georganna Skeans, MD, MPH, FACS ?Trauma & General Surgery ?Use [email or web address] to contact on call provider ? ?09/11/2021  ?

## 2021-09-12 ENCOUNTER — Encounter (HOSPITAL_COMMUNITY): Payer: Self-pay | Admitting: General Surgery

## 2021-09-12 ENCOUNTER — Encounter (HOSPITAL_COMMUNITY): Payer: Self-pay

## 2021-09-12 LAB — COMPREHENSIVE METABOLIC PANEL
ALT: 397 U/L — ABNORMAL HIGH (ref 0–44)
AST: 183 U/L — ABNORMAL HIGH (ref 15–41)
Albumin: 3 g/dL — ABNORMAL LOW (ref 3.5–5.0)
Alkaline Phosphatase: 121 U/L (ref 38–126)
Anion gap: 6 (ref 5–15)
BUN: 9 mg/dL (ref 8–23)
CO2: 24 mmol/L (ref 22–32)
Calcium: 8.6 mg/dL — ABNORMAL LOW (ref 8.9–10.3)
Chloride: 109 mmol/L (ref 98–111)
Creatinine, Ser: 0.67 mg/dL (ref 0.44–1.00)
GFR, Estimated: >60 mL/min (ref >60)
Glucose, Bld: 88 mg/dL (ref 70–99)
Potassium: 4 mmol/L (ref 3.5–5.1)
Sodium: 139 mmol/L (ref 135–145)
Total Bilirubin: 1 mg/dL (ref 0.3–1.2)
Total Protein: 5.9 g/dL — ABNORMAL LOW (ref 6.5–8.1)

## 2021-09-12 MED ORDER — OXYCODONE HCL 5 MG PO TABS: 5.0000 mg | ORAL_TABLET | ORAL | 0 refills | Status: DC | PRN

## 2021-09-12 MED ORDER — ACETAMINOPHEN 500 MG PO TABS
1000.0000 mg | ORAL_TABLET | Freq: Four times a day (QID) | ORAL | 0 refills | Status: DC | PRN
Start: 2021-09-12 — End: 2024-02-24

## 2021-09-12 NOTE — Progress Notes (Signed)
Mobility Specialist Progress Note: ? ? 09/12/21 1149  ?Mobility  ?Activity Ambulated with assistance to bathroom;Ambulated with assistance in room  ?Level of Assistance Independent  ?Assistive Device None  ?Distance Ambulated (ft) 20 ft  ?Activity Response Tolerated well  ?$Mobility charge 1 Mobility  ? ?Pt received in bathroom. No complaints of pain. Eager for discharge. Left in bed with call bell in reach and all needs met.  ? ?[place] ?Mobility Specialist ?Primary Phone [phone] ? ?

## 2021-09-12 NOTE — TOC Transition Note (Signed)
Transition of Care (TOC) - CM/SW Discharge Note ? ? ?Patient Details  ?Name: [name] ?MRN: [id number] ?Date of Birth: 1956-02-27 ? ?Transition of Care (TOC) CM/SW Contact:  ?Bartholomew Crews, RN ?Phone Number: [phone] ?09/12/2021, 2:10 PM ? ? ?Clinical Narrative:    ? ?Patient to transition home today. No TOC needs identified at this time.  ? ?Final next level of care: Home/Self Care ?Barriers to Discharge: No Barriers Identified ? ? ?Patient Goals and CMS Choice ?  ?  ?  ? ?Discharge Placement ?  ?           ?  ?  ?  ?  ? ?Discharge Plan and Services ?  ?  ?           ?  ?  ?  ?  ?  ?  ?  ?  ?  ?  ? ?Social Determinants of Health (SDOH) Interventions ?  ? ? ?Readmission Risk Interventions ?   ? View : No data to display.  ?  ?  ?  ? ? ? ? ? ?

## 2021-09-14 LAB — SURGICAL PATHOLOGY

## 2023-09-22 IMAGING — US US ABDOMEN LIMITED
1 series · 14 of 25 positions shown · non-contrast
Comparison: CT earlier today

CLINICAL DATA: Cholelithiasis

EXAM:
ULTRASOUND ABDOMEN LIMITED RIGHT UPPER QUADRANT

[Series 1: us abdomen limited ruq (liver/gb) · 14 of 56 slices shown]
[im 1/56]
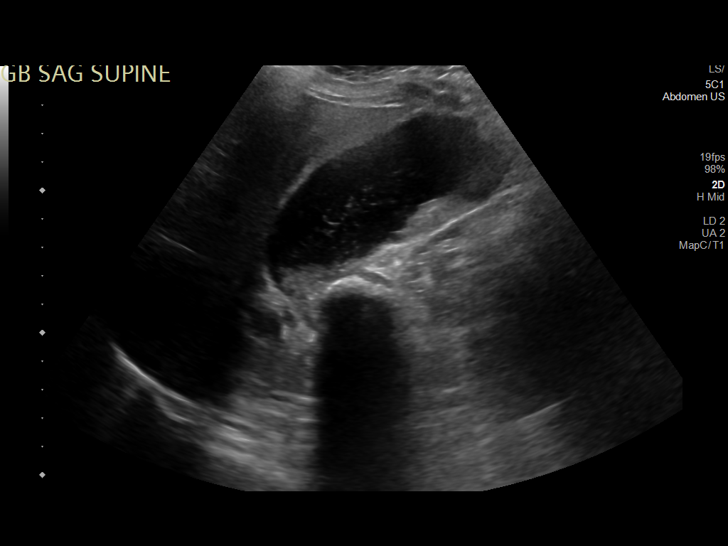
[im 5/56]
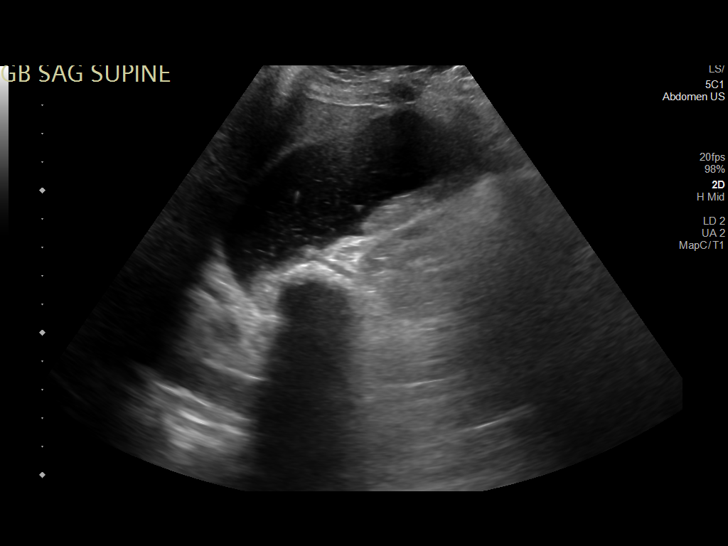
[im 10/56]
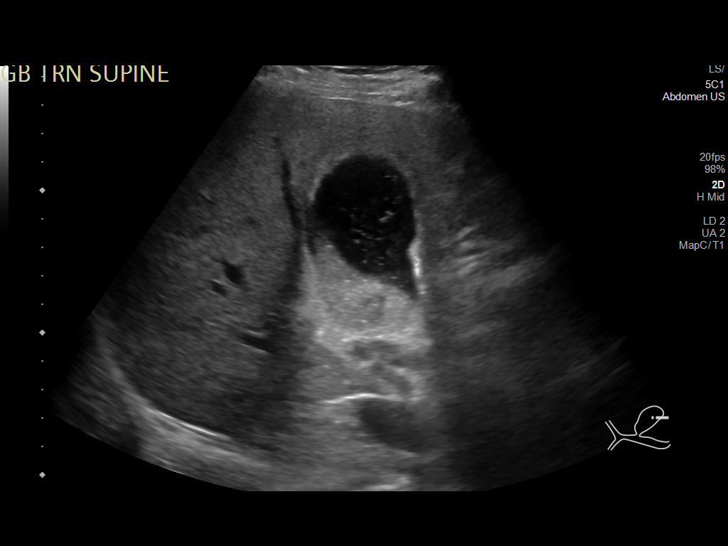
[im 14/56]
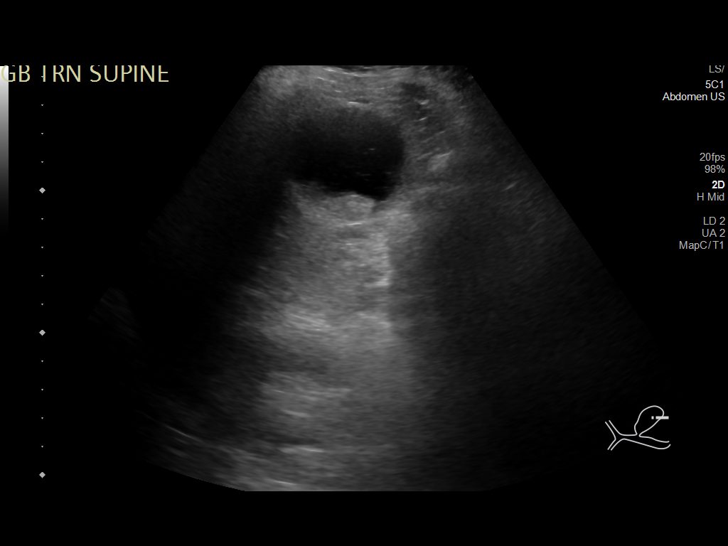
[im 19/56]
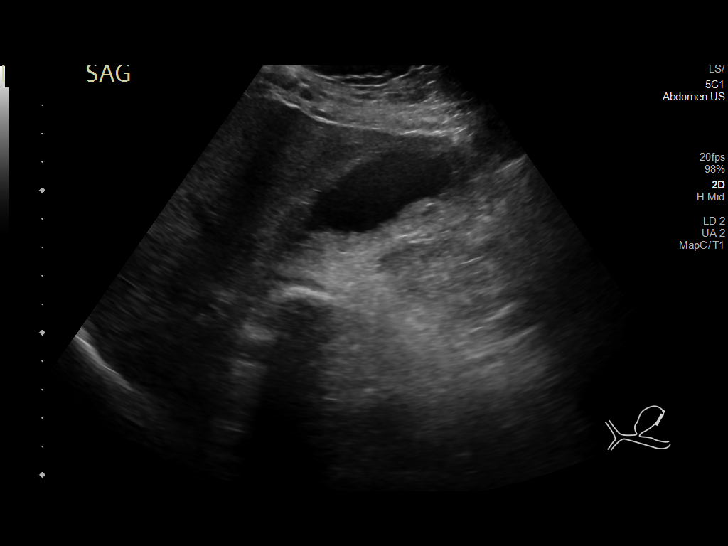
[im 21/56]
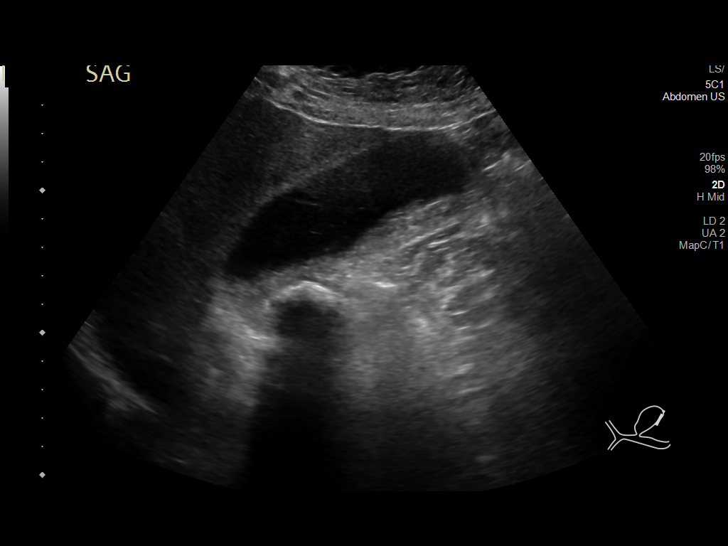
[im 26/56]
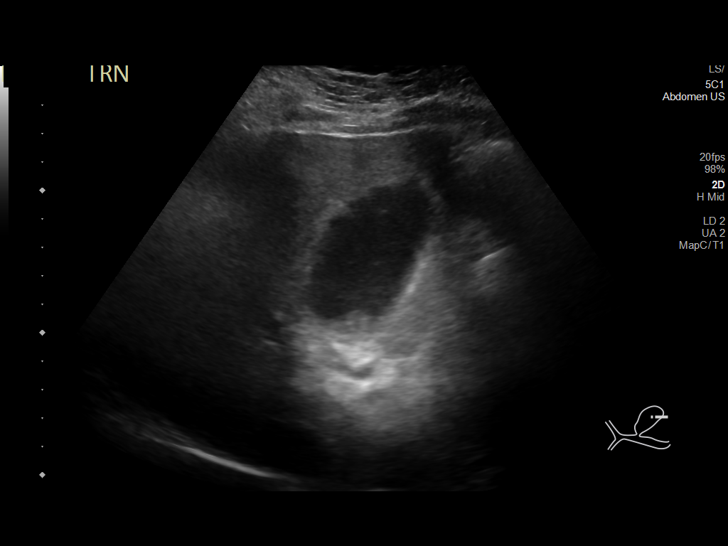
[im 30/56]
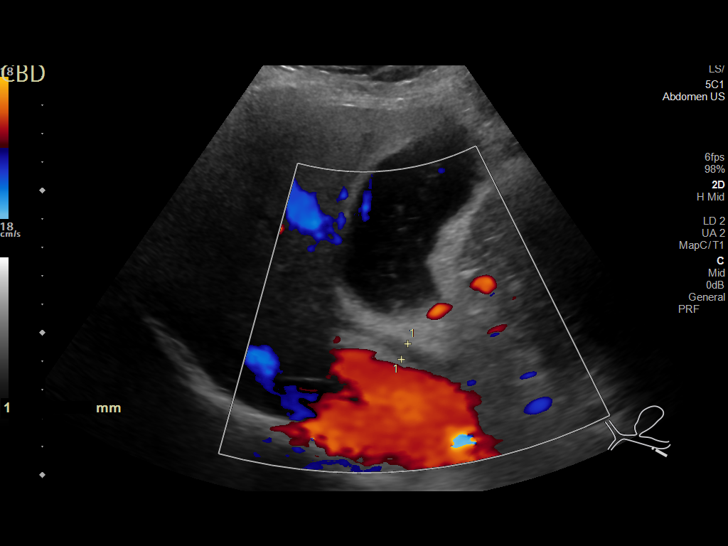
[im 35/56]
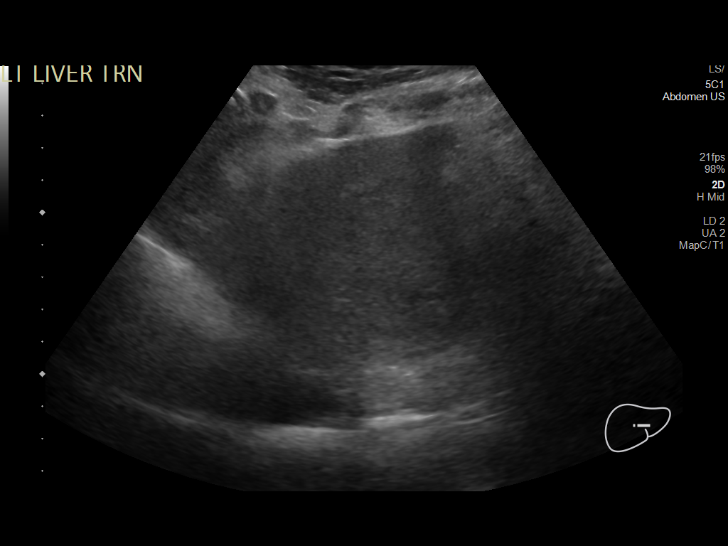
[im 37/56]
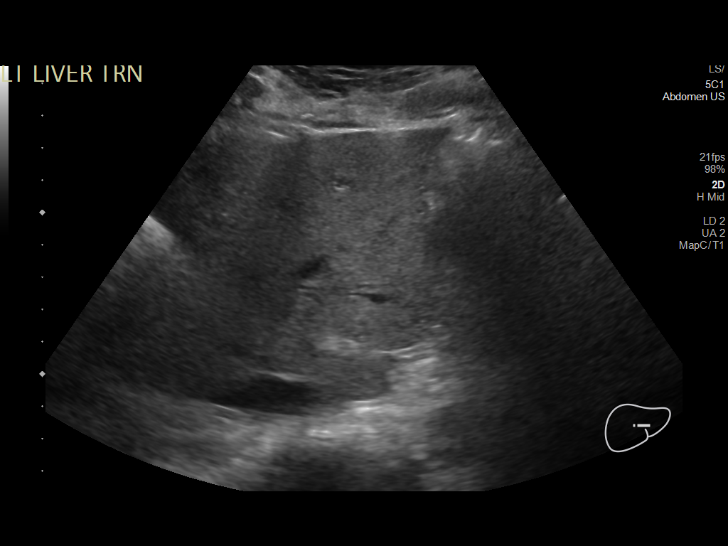
[im 42/56]
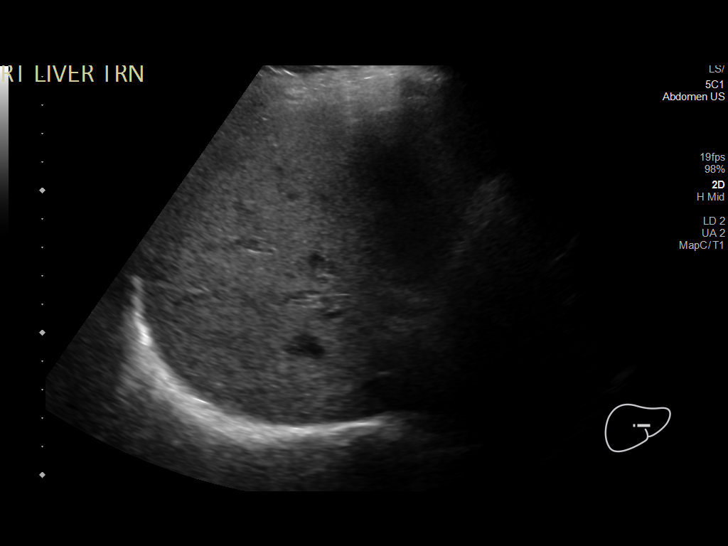
[im 46/56]
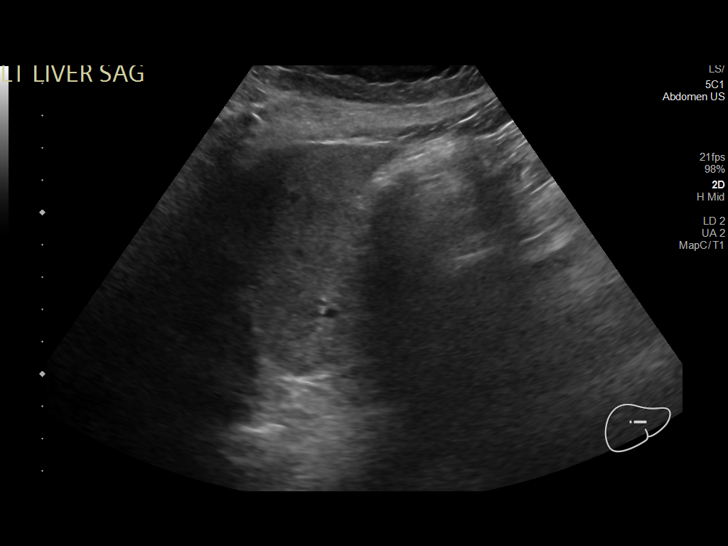
[im 51/56]
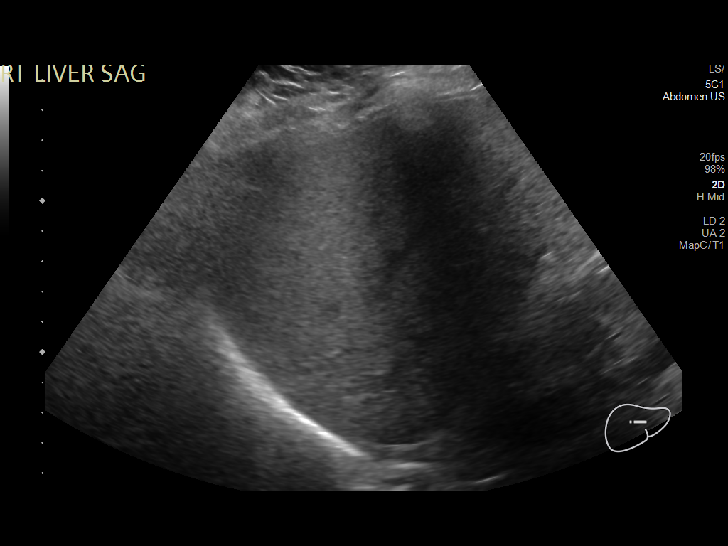
[im 56/56]
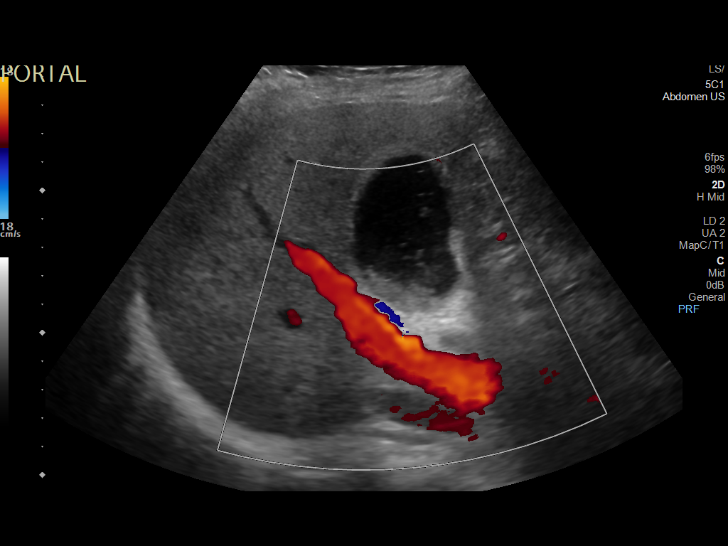

[14 of 25 positions shown; findings below may reference images not displayed]

FINDINGS: Gallbladder:

3.2 cm gallstone within the gallbladder. There is also sludge noted
within the gallbladder. Gallbladder wall is thickened at 5 mm with
trace pericholecystic fluid. Negative sonographic Murphy sign.

Common bile duct:

Diameter: Normal caliber, 6 mm

Liver:

Slight increased echotexture throughout the liver suggesting fatty
infiltration. No focal hepatic abnormality. Portal vein is patent on
color Doppler imaging with normal direction of blood flow towards
the liver.

Other: None.
IMPRESSION: Gallstone and sludge throughout the gallbladder. Mild gallbladder
wall thickening and small amount of pericholecystic fluid. Recommend
clinical correlation to exclude acute cholecystitis.

Hepatic steatosis.

## 2023-09-22 IMAGING — RF DG CHOLANGIOGRAM OPERATIVE
1 series · 1 of 1 positions shown · non-contrast
Comparison: 09/11/2021

CLINICAL DATA: 65-year-old female with history of cholelithiasis.

EXAM:
INTRAOPERATIVE CHOLANGIOGRAM
TECHNIQUE: Cholangiographic images from the C-arm fluoroscopic device were
submitted for interpretation post-operatively. Please see the
procedural report for the amount of contrast and the fluoroscopy
time utilized.

[Series 1: run · 1 of 1 slices shown]
[im 1/1]
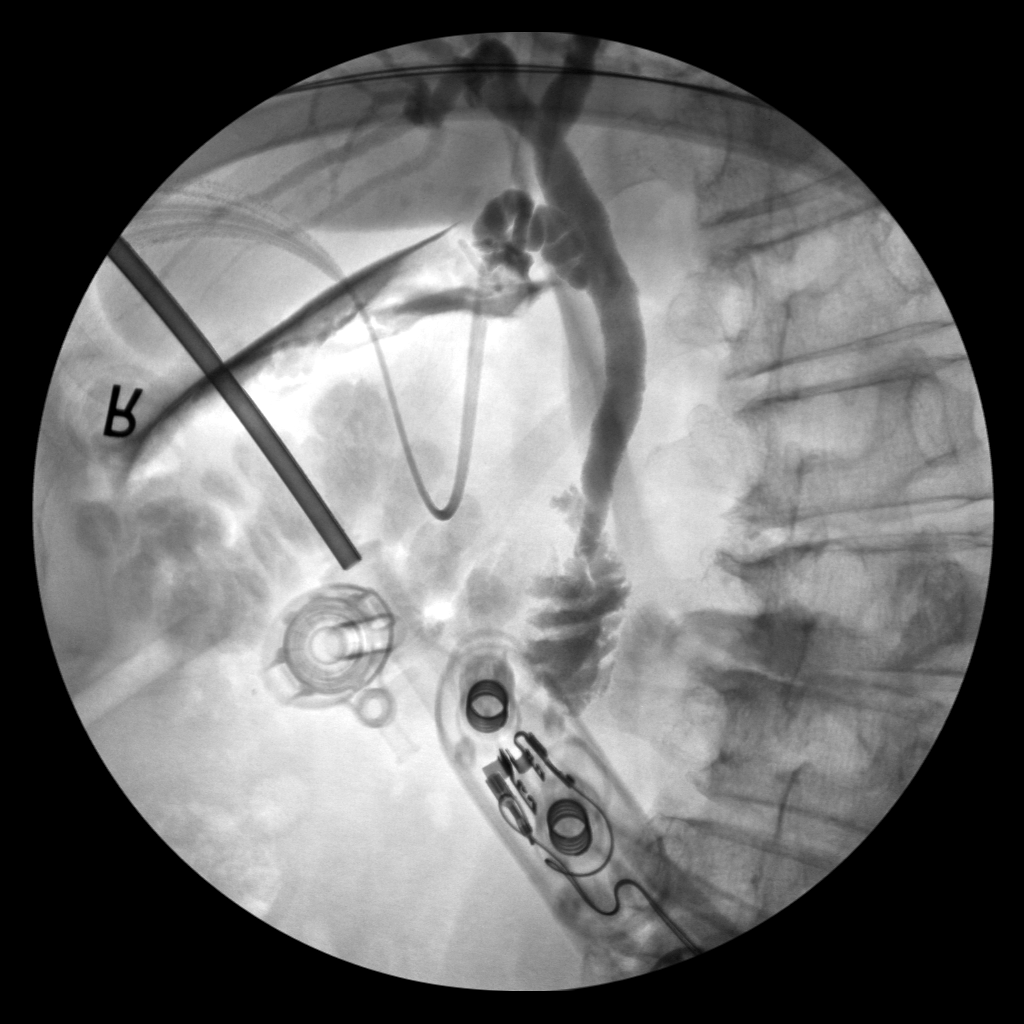

[1 of 1 positions shown; findings below may reference images not displayed]

FINDINGS: Intraoperative antegrade injection via the cystic duct which
opacifies the common bile duct and central portions of the
intrahepatic biliary tree. Contrast is visualized flowing freely
into the duodenum. There are no filling defects. No significant
intra or extrahepatic biliary ductal dilation. No apparent anomalous
anatomical configuration of the biliary tree.
IMPRESSION: No evidence of choledocholithiasis.

## 2024-02-24 ENCOUNTER — Ambulatory Visit: Attending: Cardiology | Admitting: Cardiology

## 2024-02-24 ENCOUNTER — Encounter: Payer: Self-pay | Admitting: Cardiology

## 2024-02-24 VITALS — BP 137/78 | HR 71 | Resp 16 | Ht 60.0 in | Wt 137.0 lb

## 2024-02-24 DIAGNOSIS — I10 Essential (primary) hypertension: Secondary | ICD-10-CM | POA: Insufficient documentation

## 2024-02-24 DIAGNOSIS — R739 Hyperglycemia, unspecified: Secondary | ICD-10-CM | POA: Insufficient documentation

## 2024-02-24 DIAGNOSIS — R0609 Other forms of dyspnea: Secondary | ICD-10-CM | POA: Insufficient documentation

## 2024-02-24 DIAGNOSIS — E7800 Pure hypercholesterolemia, unspecified: Secondary | ICD-10-CM | POA: Insufficient documentation

## 2024-02-24 DIAGNOSIS — R072 Precordial pain: Secondary | ICD-10-CM | POA: Diagnosis present

## 2024-02-24 MED ORDER — METOPROLOL TARTRATE 50 MG PO TABS: 50.0000 mg | ORAL_TABLET | Freq: Two times a day (BID) | ORAL | 0 refills | Status: DC

## 2024-02-24 NOTE — Progress Notes (Unsigned)
 Cardiology Office Note:  .   Date:  02/25/2024  ID:  [name], DOB Oct 21, 1955, MRN [id number] PCP: Cleotilde Planas, MD  [facility] Providers Cardiologist:  Gordy Bergamo, MD   History of Present Illness: .   [name] is a 68 y.o. Asian female patient with hypertension, hyperglycemia, hyperlipidemia and chronic palpitations was evaluated by me in 2019 for palpitations now presents with exertional dyspnea with precordial chest discomfort relieved with rest that started about 2 to 3 months ago.  She also has  palpitations that occur at night or when she is resting but not bothered by these and stable.  Cardiac Studies relevent.    Coronary calcium score 05/28/2021: Coronary calcium score of 0.  Visualized ascending and descending aorta is normal.  Chest x-ray PA and lateral view 05/30/2023: Cardiac silhouette is unremarkable. No pneumothorax or pleural effusion. The lungs are clear. S shaped thoracolumbar scoliosis.   Treadmill exercise stress test 08/01/2017: Indication: SOB The patient exercised on Bruce protocol for 07:31 min. Patient achieved 9.40 METS and reached HR 169 bpm, which is 107 % of maximum age-predicted HR. Stress test terminated due to fatigue. Resting BP 150/82, peak BP 198/98 mm Hg. HR Response to Exercise: Appropriate. Arrhythmias: none. ST Changes: With peak exercise there was no ST-T changes of ischemia. Exercise capacity was below average for age. Overall Impression: Normal stress test. Continue primary/secondary prevention.   Echocardiogram 08/02/2017: Left ventricle cavity is normal in size. Normal global wall motion. Doppler evidence of grade I (impaired) diastolic dysfunction, normal LAP. Calculated EF 63%. Mild to moderate mitral regurgitation. Mild tricuspid regurgitation. No evidence of pulmonary hypertension.    Discussed the use of AI scribe software for clinical note transcription with the patient, who gave verbal consent to proceed.  History of  Present Illness [name] is a 68 year old female with hypertension who presents with shortness of breath and palpitations.  She experiences shortness of breath during daily exercise, especially when walking to the park, for the past two to three months. The shortness of breath is intermittent and sometimes makes her feel 'very heavy,' affecting her ability to walk.  Palpitations occur mostly when sitting or at night, described as a fast heartbeat that stops suddenly. There is no associated chest pain, but she feels tightness in her chest.  She takes amlodipine 2.5 mg daily for hypertension since February 10, 2024, and Crestor 5 mg for cholesterol management. Her coronary calcium score was zero in 2022, and a stress test in 2019 was normal, though her blood pressure was high. An ultrasound of the heart showed mild valve leakiness but was otherwise normal.   Labs   Lab Results  Component Value Date   NA 144 02/24/2024   K 4.9 02/24/2024   CO2 23 02/24/2024   GLUCOSE 88 02/24/2024   BUN 16 02/24/2024   CREATININE 0.70 02/24/2024   CALCIUM 10.0 02/24/2024   EGFR 94 02/24/2024   GFRNONAA >60 09/12/2021    Care everywhere/Faxed External Labs:  Labs 05/09/2023:  Total cholesterol 150, triglycerides 163, HDL 55, LDL 67.  Potassium 4.0.  A1c 6.0%.  Labs 05/03/2022:  TSH normal at 2.060.  ROS  Review of Systems  HENT:  Odynophagia: occasional.   Cardiovascular:  Positive for chest pain, dyspnea on exertion and palpitations. Negative for leg swelling.   Physical Exam:   VS:  BP 137/78 (BP Location: Left Arm, Patient Position: Sitting, Cuff Size: Normal)   Pulse 71  Resp 16   Ht 5' (1.524 m)   Wt 137 lb (62.1 kg)   SpO2 100%   BMI 26.76 kg/m    Wt Readings from Last 3 Encounters:  02/24/24 137 lb (62.1 kg)  09/10/21 139 lb 15.9 oz (63.5 kg)  10/26/19 140 lb (63.5 kg)    BP Readings from Last 3 Encounters:  02/24/24 137/78  09/12/21 (!) 144/88  10/26/19 117/76    Physical Exam Neck:     Vascular: No carotid bruit or JVD.  Cardiovascular:     Rate and Rhythm: Normal rate and regular rhythm.     Pulses: Intact distal pulses.     Heart sounds: Normal heart sounds. No murmur heard.    No gallop.  Pulmonary:     Effort: Pulmonary effort is normal.     Breath sounds: Normal breath sounds.  Abdominal:     General: Bowel sounds are normal.     Palpations: Abdomen is soft.  Musculoskeletal:     Right lower leg: No edema.     Left lower leg: No edema.    EKG:    EKG Interpretation Date/Time:  Friday February 24 2024 10:38:16 EDT Ventricular Rate:  63 PR Interval:  152 QRS Duration:  80 QT Interval:  388 QTC Calculation: 397 R Axis:   22  Text Interpretation: EKG 02/24/2024: Normal sinus rhythm at rate of 63 bpm, normal axis, poor R progression, probably normal variant.  No evidence of ischemia.  No significant change from 09/10/2021. Confirmed by Zamari Bonsall, Jagadeesh ([id number]) on 02/24/2024 10:45:07 AM    ASSESSMENT AND PLAN: .      ICD-10-CM   1. Precordial pain  R07.2 EKG 12-Lead    metoprolol  tartrate (LOPRESSOR ) 50 MG tablet    CT CORONARY MORPH W/CTA COR W/SCORE W/CA W/CM &/OR WO/CM    2. Dyspnea on exertion  R06.09 CT CORONARY MORPH W/CTA COR W/SCORE W/CA W/CM &/OR WO/CM    3. Primary hypertension  I10 Basic metabolic panel with GFR    4. Pure hypercholesterolemia  E78.00     5. Hyperglycemia  R73.9      Assessment & Plan Dyspnea on exertion, palpitations, and precordial chest tightness under evaluation for possible coronary artery disease Intermittent dyspnea on exertion, palpitations, and precordial chest tightness over the past few months. Symptoms are concerning for possible coronary artery disease. Previous coronary calcium score was zero in 2022, and a stress test in 2019 was normal. However, given her age, gender, and risk factors including hypercholesterolemia and prediabetes, further evaluation is warranted. - Order  coronary CT angiogram to assess for coronary artery disease. - Prescribe metoprolol  to be taken two days before and on the day of the CT angiogram to lower heart rate to 45-50 bpm. Instruct to take one tablet twice a day, and if feeling excessively tired or weak, cut the dose in half. - Instruct to stop amlodipine while taking metoprolol  to prevent hypotension. - Perform blood work to assess kidney function prior to CT angiogram.  Essential hypertension Recently diagnosed essential hypertension, currently managed with amlodipine 2.5 mg started on February 10, 2024. Blood pressure management is crucial, especially in the context of her cardiovascular risk factors. - Temporarily discontinue amlodipine while taking metoprolol  for CT angiogram preparation. - Resume amlodipine after completion of CT angiogram. - BMP from today reviewed, normal renal function and potassium levels, stable to proceed with CTA.  Pure hypercholesterolemia Hypercholesterolemia managed with Crestor (rosuvastatin) 5 mg, resulting in an LDL of 67 mg/dL,  indicating effective control of cholesterol levels.  Prediabetes Prediabetes is a risk factor for cardiovascular disease. Lifestyle modifications are important, but pharmacological intervention may be considered in the future based on further evaluation.  Thoracolumbar scoliosis with associated chest wall pain Thoracolumbar scoliosis with associated chest wall pain, likely due to costochondral junction discomfort.  This pain is different from precordial pain and she has this occasionally and tender to touch at the suprasternal notch area.  Previous chest X-ray in December 2024 confirmed scoliosis.   Follow up: Following about test to make further discussions and recommendations.  Signed,  Gordy Bergamo, MD, [facility] 02/25/2024, 8:21 AM [facility] [address] [place], [place] [address] Phone: [phone]. Fax:  [phone]

## 2024-02-24 NOTE — Patient Instructions (Addendum)
 Medication Instructions:  START Metoprolol  tartrate 50 mg twice daily for two days and on day of CT scan.  If you feel tired, you can cut the medication in half per Dr. Ladona  *If you need a refill on your cardiac medications before your next appointment, please call your pharmacy*  Lab Work: BMET If you have labs (blood work) drawn today and your tests are completely normal, you will receive your results only by: MyChart Message (if you have MyChart) OR A paper copy in the mail If you have any lab test that is abnormal or we need to change your treatment, we will call you to review the results.  Testing/Procedures: Coronary CTA Your physician has requested that you have cardiac CT. Cardiac computed tomography (CT) is a painless test that uses an x-ray machine to take clear, detailed pictures of your heart. For further information please visit [email or web address] Please follow instruction sheet as given.    Follow-Up: At [facility], you and your health needs are our priority.  As part of our continuing mission to provide you with exceptional heart care, our providers are all part of one team.  This team includes your primary Cardiologist (physician) and Advanced Practice Providers or APPs (Physician Assistants and Nurse Practitioners) who all work together to provide you with the care you need, when you need it.  Your next appointment:   AFTER CT Scan is completed  Provider:   Gordy Ladona, MD    We recommend signing up for the patient portal called MyChart.  Sign up information is provided on this After Visit Summary.  MyChart is used to connect with patients for Virtual Visits (Telemedicine).  Patients are able to view lab/test results, encounter notes, upcoming appointments, etc.  Non-urgent messages can be sent to your provider as well.   To learn more about what you can do with MyChart, go to [email or web address].   Other Instructions   Your cardiac CT will be  scheduled at one of the below locations:   [facility]  [address] [place], [place] [address] [phone]  Please follow these instructions carefully (unless otherwise directed):  An IV will be required for this test and Nitroglycerin will be given.   On the Night Before the Test: Be sure to Drink plenty of water. Do not consume any caffeinated/decaffeinated beverages or chocolate 12 hours prior to your test. Do not take any antihistamines 12 hours prior to your test.  On the Day of the Test: Drink plenty of water until 1 hour prior to the test. Do not eat any food 1 hour prior to test. You may take your regular medications prior to the test.  Take metoprolol  (Lopressor ) 50 mg two hours prior to test. FEMALES- please wear underwire-free bra if available, avoid dresses & tight clothing      After the Test: Drink plenty of water. After receiving IV contrast, you may experience a mild flushed feeling. This is normal. On occasion, you may experience a mild rash up to 24 hours after the test. This is not dangerous. If this occurs, you can take Benadryl 25 mg and increase your fluid intake. If you experience trouble breathing, this can be serious. If it is severe call 911 IMMEDIATELY. If it is mild, please call our office.  We will call to schedule your test 2-4 weeks out understanding that some insurance companies will need an authorization prior to the service being performed.   For more information  and frequently asked questions, please visit our website : [email or web address]  For non-scheduling related questions, please contact the cardiac imaging nurse navigator should you have any questions/concerns: Cardiac Imaging Nurse Navigators Direct Office Dial: [phone]   For scheduling needs, including cancellations and rescheduling, please call [place], [phone].

## 2024-02-25 ENCOUNTER — Ambulatory Visit: Payer: Self-pay | Admitting: Cardiology

## 2024-02-25 LAB — BASIC METABOLIC PANEL WITH GFR
BUN/Creatinine Ratio: 23 (ref 12–28)
BUN: 16 mg/dL (ref 8–27)
CO2: 23 mmol/L (ref 20–29)
Calcium: 10 mg/dL (ref 8.7–10.3)
Chloride: 106 mmol/L (ref 96–106)
Creatinine, Ser: 0.7 mg/dL (ref 0.57–1.00)
Glucose: 88 mg/dL (ref 70–99)
Potassium: 4.9 mmol/L (ref 3.5–5.2)
Sodium: 144 mmol/L (ref 134–144)
eGFR: 94 mL/min/1.73 (ref >59)

## 2024-02-29 ENCOUNTER — Telehealth (HOSPITAL_COMMUNITY): Payer: Self-pay | Admitting: Emergency Medicine

## 2024-02-29 NOTE — Telephone Encounter (Signed)
 Attempted to call patient regarding upcoming cardiac CT appointment. Left message on voicemail with name and callback number Rockwell Alexandria RN Navigator Cardiac Imaging [facility] [phone] Office [phone] Cell

## 2024-02-29 NOTE — Telephone Encounter (Signed)
 Reaching out to patient to offer assistance regarding upcoming cardiac imaging study; pt verbalizes understanding of appt date/time, parking situation and where to check in, pre-test NPO status and medications ordered, and verified current allergies; name and call back number provided for further questions should they arise Rockwell Alexandria RN Navigator Cardiac Imaging [facility] [phone] office [phone] cell

## 2024-03-01 ENCOUNTER — Ambulatory Visit (HOSPITAL_COMMUNITY)
Admission: RE | Admit: 2024-03-01 | Discharge: 2024-03-01 | Disposition: A | Discharge status: Home / Self Care | Source: Ambulatory Visit | Attending: Cardiology | Admitting: Cardiology

## 2024-03-01 DIAGNOSIS — R0609 Other forms of dyspnea: Secondary | ICD-10-CM | POA: Insufficient documentation

## 2024-03-01 DIAGNOSIS — R072 Precordial pain: Secondary | ICD-10-CM | POA: Diagnosis present

## 2024-03-01 MED ORDER — NITROGLYCERIN 0.4 MG SL SUBL
0.8000 mg | SUBLINGUAL_TABLET | Freq: Once | SUBLINGUAL | Status: AC
  Administered 2024-03-01: 0.8 mg via SUBLINGUAL

## 2024-03-01 MED ORDER — IOHEXOL 350 MG/ML SOLN
100.0000 mL | Freq: Once | INTRAVENOUS | Status: AC | PRN
  Administered 2024-03-01: 100 mL via INTRAVENOUS

## 2024-03-03 NOTE — Progress Notes (Signed)
 Normal coronary arteries and calcium score of zero suggests very low probability of future cardiac events. Recommend continued exercise and probably will benefit from joining an exercise program

## 2024-03-08 ENCOUNTER — Ambulatory Visit: Admitting: Cardiology

## 2024-05-18 ENCOUNTER — Ambulatory Visit: Payer: PRIVATE HEALTH INSURANCE | Attending: Cardiology | Admitting: Cardiology

## 2024-05-18 ENCOUNTER — Other Ambulatory Visit (HOSPITAL_COMMUNITY): Payer: Self-pay

## 2024-05-18 ENCOUNTER — Encounter: Payer: Self-pay | Admitting: Cardiology

## 2024-05-18 VITALS — BP 122/78 | HR 87 | Ht <= 58 in | Wt 138.5 lb

## 2024-05-18 DIAGNOSIS — E7800 Pure hypercholesterolemia, unspecified: Secondary | ICD-10-CM | POA: Diagnosis not present

## 2024-05-18 DIAGNOSIS — I10 Essential (primary) hypertension: Secondary | ICD-10-CM

## 2024-05-18 DIAGNOSIS — K219 Gastro-esophageal reflux disease without esophagitis: Secondary | ICD-10-CM

## 2024-05-18 DIAGNOSIS — R0609 Other forms of dyspnea: Secondary | ICD-10-CM | POA: Diagnosis not present

## 2024-05-18 DIAGNOSIS — R079 Chest pain, unspecified: Secondary | ICD-10-CM

## 2024-05-18 MED ORDER — OMEPRAZOLE 20 MG PO CPDR
20.0000 mg | DELAYED_RELEASE_CAPSULE | Freq: Every day | ORAL | 0 refills | Status: AC | PRN
  Filled 2024-05-18: qty 90, 90d supply, fill #0

## 2024-05-18 NOTE — Progress Notes (Signed)
 Cardiology Office Note:  .   Date:  05/19/2024  ID:  [name], DOB 1955/08/31, MRN [id number] PCP: Cleotilde Planas, MD  [facility] Providers Cardiologist:  Gordy Bergamo, MD   History of Present Illness: .   [name] is a 68 y.o. Asian female patient with hypertension, hyperglycemia, hypercholesterolemia and chronic palpitations presented to me with precordial pain on 02/25/2024 and underwent coronary CTA and no presents for follow-up.  She has felt reassured and states that her chest pain symptoms are better but also endorses significant GERD like symptoms ongoing for quite a long time especially since she discontinued PPI as she did not want to take this on a long-term basis dyspnea has improved as she has started to exercise after knowing her coronary CTA was normal.    Discussed the use of AI scribe software for clinical note transcription with the patient, who gave verbal consent to proceed.  History of Present Illness [name] is a 68 year old female with mitral and tricuspid regurgitation who presents with chest tightness, shortness of breath, and palpitations.  She has exertional chest tightness and shortness of breath, most noticeable when walking uphill. Symptoms are stable compared with her last visit. She has been exercising more and feels somewhat improved but still has exertional dyspnea.  Palpitations occur mainly when she is lying down and improve when she sits up.  She recently had a coronary CT angiogram. An echocardiogram in 2019 showed mild to moderate mitral regurgitation and mild tricuspid regurgitation.  She takes Crestor 5 mg daily and amlodipine 2.5 mg daily.  She has intermittent acid reflux treated with omeprazole  as needed.  Her father may have had similar valve problems, though she is unsure if this is familial.   Cardiac Studies relevent.    CT CORONARY MORPH W/CTA COR W/SCORE 03/01/2024  1. No evidence of CAD, 0% stenosis, CADRADS 0.   2.  Coronary calcium score of 0.  3. Normal coronary origins with right dominance. Tortuous coronary arteries. 4. Normal size of the pulmonary artery.  5. No significant extra cardiac abnormality.   Echocardiogram 08/02/2017: Left ventricle cavity is normal in size. Normal global wall motion. Doppler evidence of grade I (impaired) diastolic dysfunction, normal LAP. Calculated EF 63%. Mild to moderate mitral regurgitation. Mild tricuspid regurgitation. No evidence of pulmonary hypertension. Labs   Care everywhere/Faxed External Labs:  Labs 05/09/2023:   Total cholesterol 150, triglycerides 163, HDL 55, LDL 67.   Potassium 4.0.   A1c 6.0%.   Labs 05/03/2022:   TSH normal at 2.060.  ROS  Review of Systems  Cardiovascular:  Positive for chest pain and dyspnea on exertion. Negative for leg swelling.  Gastrointestinal:  Positive for heartburn.   Physical Exam:   VS:  BP 122/78 (BP Location: Left Arm, Patient Position: Sitting, Cuff Size: Normal)   Pulse 87   Ht 4' 9 (1.448 m)   Wt 138 lb 8 oz (62.8 kg)   SpO2 96%   BMI 29.97 kg/m    Wt Readings from Last 3 Encounters:  05/18/24 138 lb 8 oz (62.8 kg)  02/24/24 137 lb (62.1 kg)  09/10/21 139 lb 15.9 oz (63.5 kg)    BP Readings from Last 3 Encounters:  05/18/24 122/78  03/01/24 134/72  02/24/24 137/78   Physical Exam Neck:     Vascular: No carotid bruit or JVD.  Cardiovascular:     Rate and Rhythm: Normal rate and regular rhythm.  Pulses: Intact distal pulses.     Heart sounds: Normal heart sounds. No murmur heard.    No gallop.  Pulmonary:     Effort: Pulmonary effort is normal.     Breath sounds: Normal breath sounds.  Abdominal:     General: Bowel sounds are normal.     Palpations: Abdomen is soft.  Musculoskeletal:     Right lower leg: No edema.     Left lower leg: No edema.    EKG:         ASSESSMENT AND PLAN: .      ICD-10-CM   1. Chest pain due to GERD  K21.9 omeprazole  (PRILOSEC) 20 MG capsule    R07.9     2. Dyspnea on exertion  R06.09 DG Chest 2 View    ECHOCARDIOGRAM COMPLETE    3. Primary hypertension  I10 DG Chest 2 View    ECHOCARDIOGRAM COMPLETE    4. Pure hypercholesterolemia  E78.00      Assessment & Plan Gastroesophageal reflux disease Chronic gastroesophageal reflux disease with intermittent symptoms. Symptoms managed with intermittent use of omeprazole . Potential contribution to chest pain and dyspnea. - Prescribed omeprazole  20 mg daily as needed, to be taken on an empty stomach before breakfast. - Advised to take omeprazole  for 3-4 days during symptom flare-ups and then stop. - Instructed to consult with Dr. Olam Pinal for further management if symptoms persist.  Dyspnea on exertion Intermittent dyspnea on exertion, particularly when climbing stairs. Coronary CT angiogram showed no blockages or coronary disease. Differential includes reactive airway disease or acid reflux. Symptoms are well-managed and not worsening. - Ordered echocardiogram to assess cardiac function and valve status. - Ordered chest x-ray to rule out other causes of dyspnea. - Advised to continue current exercise regimen and monitor symptoms. - Instructed to consult with Dr. Olam Pinal if dyspnea worsens.  Primary hypertension Hypertension well-controlled with amlodipine 2.5 mg daily. Blood pressure reading is 122/78 mmHg. - Continue amlodipine 2.5 mg daily.  Pure hypercholesterolemia Hypercholesterolemia well-controlled with rosuvastatin 5 mg daily. LDL cholesterol is 67 mg/dL. - Continue rosuvastatin 5 mg daily.  Mitral and tricuspid valve regurgitation Mild to moderate mitral regurgitation and mild tricuspid regurgitation noted on echocardiogram from 2019. No current murmurs detected. Coronary CT angiogram ruled out coronary artery disease. - Ordered echocardiogram to reassess valve function. - If echocardiogram is normal, no further action is needed. - Could not appreciate murmur  on exam.   Follow up: I will personally perform the test and if I find abnormalities,  will perform further evaluation. Otherwise unless new on ongoing symptoms(patient advised to contact us ), preventive  therapy is recommended. I will then see the patient on a PRN basis.   Signed,  Gordy Bergamo, MD, [facility] 05/19/2024, 8:22 AM [facility] [address] [place], [place] [address] Phone: [phone]. Fax:  [phone]

## 2024-05-18 NOTE — Patient Instructions (Addendum)
 Medication Instructions:  START OMEPRAZOLE  20mg  by mouth daily as needed for heartburn.   *If you need a refill on your cardiac medications before your next appointment, please call your pharmacy*   Testing/Procedures: ECHOCARDIOGRAM AND CHEST X-RAY  Your physician has requested that you have an echocardiogram. Echocardiography is a painless test that uses sound waves to create images of your heart. It provides your doctor with information about the size and shape of your heart and how well your heart's chambers and valves are working. This procedure takes approximately one hour. There are no restrictions for this procedure. Please do NOT wear cologne, perfume, aftershave, or lotions (deodorant is allowed). Please arrive 15 minutes prior to your appointment time.  Please note: We ask at that you not bring children with you during ultrasound (echo/ vascular) testing. Due to room size and safety concerns, children are not allowed in the ultrasound rooms during exams. Our front office staff cannot provide observation of children in our lobby area while testing is being conducted. An adult accompanying a patient to their appointment will only be allowed in the ultrasound room at the discretion of the ultrasound technician under special circumstances. We apologize for any inconvenience.   ////////////////////////////////////////////////////////////////////////////////////////////////////////////////////////////////////////  Your physician has requested that you have an a chest xray. A chest x-ray takes a picture of the organs and structures inside the chest, including the heart, lungs, and blood vessels. This test can show several things, including, whether the heart is enlarges; whether fluid is building up in the lungs; and whether pacemaker / defibrillator leads are still in place.   Follow-Up: At [facility], you and your health needs are our priority.  As part of our continuing mission  to provide you with exceptional heart care, our providers are all part of one team.  This team includes your primary Cardiologist (physician) and Advanced Practice Providers or APPs (Physician Assistants and Nurse Practitioners) who all work together to provide you with the care you need, when you need it.  Your next appointment:   As needed  Provider:   Gordy Bergamo, MD

## 2024-05-19 ENCOUNTER — Encounter: Payer: Self-pay | Admitting: Cardiology

## 2024-06-27 ENCOUNTER — Ambulatory Visit: Payer: Self-pay | Admitting: Cardiology

## 2024-06-27 ENCOUNTER — Ambulatory Visit (HOSPITAL_COMMUNITY)
Admission: RE | Admit: 2024-06-27 | Discharge: 2024-06-27 | Disposition: A | Discharge status: Home / Self Care | Source: Ambulatory Visit | Attending: Cardiology | Admitting: Cardiology

## 2024-06-27 DIAGNOSIS — I10 Essential (primary) hypertension: Secondary | ICD-10-CM

## 2024-06-27 DIAGNOSIS — R0609 Other forms of dyspnea: Secondary | ICD-10-CM

## 2024-06-27 LAB — ECHOCARDIOGRAM COMPLETE: S' Lateral: 2.62 cm
